# Patient Record
Sex: Male | Born: 1945 | Race: Black or African American | Hispanic: No | Marital: Married
Health system: Southern US, Community
[De-identification: ages and names within clinical notes are randomized; demographics above are authoritative.]

## PROBLEM LIST (undated history)

## (undated) DIAGNOSIS — N184 Chronic kidney disease, stage 4 (severe): Secondary | ICD-10-CM

## (undated) DIAGNOSIS — J189 Pneumonia, unspecified organism: Secondary | ICD-10-CM

## (undated) DIAGNOSIS — J9621 Acute and chronic respiratory failure with hypoxia: Secondary | ICD-10-CM

## (undated) DIAGNOSIS — U071 COVID-19: Secondary | ICD-10-CM

## (undated) DIAGNOSIS — I482 Chronic atrial fibrillation, unspecified: Secondary | ICD-10-CM

## (undated) DIAGNOSIS — J188 Other pneumonia, unspecified organism: Secondary | ICD-10-CM

---

## 2020-09-28 ENCOUNTER — Other Ambulatory Visit (HOSPITAL_COMMUNITY): Payer: Medicare Other

## 2020-09-28 ENCOUNTER — Inpatient Hospital Stay
Admission: RE | Admit: 2020-09-28 | Discharge: 2020-11-09 | Disposition: E | Payer: Medicare Other | Attending: Internal Medicine | Admitting: Internal Medicine

## 2020-09-28 DIAGNOSIS — Z452 Encounter for adjustment and management of vascular access device: Secondary | ICD-10-CM

## 2020-09-28 DIAGNOSIS — U071 COVID-19: Secondary | ICD-10-CM | POA: Diagnosis present

## 2020-09-28 DIAGNOSIS — J188 Other pneumonia, unspecified organism: Secondary | ICD-10-CM | POA: Diagnosis present

## 2020-09-28 DIAGNOSIS — J189 Pneumonia, unspecified organism: Secondary | ICD-10-CM

## 2020-09-28 DIAGNOSIS — R0689 Other abnormalities of breathing: Secondary | ICD-10-CM

## 2020-09-28 DIAGNOSIS — R112 Nausea with vomiting, unspecified: Secondary | ICD-10-CM

## 2020-09-28 DIAGNOSIS — I482 Chronic atrial fibrillation, unspecified: Secondary | ICD-10-CM | POA: Diagnosis present

## 2020-09-28 DIAGNOSIS — J9621 Acute and chronic respiratory failure with hypoxia: Secondary | ICD-10-CM | POA: Diagnosis present

## 2020-09-28 DIAGNOSIS — Z4659 Encounter for fitting and adjustment of other gastrointestinal appliance and device: Secondary | ICD-10-CM

## 2020-09-28 DIAGNOSIS — N184 Chronic kidney disease, stage 4 (severe): Secondary | ICD-10-CM | POA: Diagnosis present

## 2020-09-28 DIAGNOSIS — J90 Pleural effusion, not elsewhere classified: Secondary | ICD-10-CM

## 2020-09-28 DIAGNOSIS — N179 Acute kidney failure, unspecified: Secondary | ICD-10-CM

## 2020-09-28 DIAGNOSIS — R1310 Dysphagia, unspecified: Secondary | ICD-10-CM

## 2020-09-28 DIAGNOSIS — L819 Disorder of pigmentation, unspecified: Secondary | ICD-10-CM

## 2020-09-28 DIAGNOSIS — Z0189 Encounter for other specified special examinations: Secondary | ICD-10-CM

## 2020-09-28 HISTORY — DX: Acute and chronic respiratory failure with hypoxia: J96.21

## 2020-09-28 HISTORY — DX: Chronic atrial fibrillation, unspecified: I48.20

## 2020-09-28 HISTORY — DX: COVID-19: U07.1

## 2020-09-28 HISTORY — DX: Other pneumonia, unspecified organism: J18.8

## 2020-09-28 HISTORY — DX: Chronic kidney disease, stage 4 (severe): N18.4

## 2020-09-28 HISTORY — DX: Pneumonia, unspecified organism: J18.9

## 2020-09-28 LAB — BLOOD GAS, ARTERIAL
Acid-base deficit: 6.1 mmol/L — ABNORMAL HIGH (ref 0.0–2.0)
Bicarbonate: 18.9 mmol/L — ABNORMAL LOW (ref 20.0–28.0)
FIO2: 55
O2 Saturation: 88 %
Patient temperature: 37
pCO2 arterial: 37.7 mmHg (ref 32.0–48.0)
pH, Arterial: 7.32 — ABNORMAL LOW (ref 7.350–7.450)
pO2, Arterial: 63.9 mmHg — ABNORMAL LOW (ref 83.0–108.0)

## 2020-09-28 LAB — PROTIME-INR
INR: 3.4 — ABNORMAL HIGH (ref 0.8–1.2)
Prothrombin Time: 33.5 seconds — ABNORMAL HIGH (ref 11.4–15.2)

## 2020-09-28 LAB — VANCOMYCIN, RANDOM: Vancomycin Rm: 23

## 2020-09-29 LAB — COMPREHENSIVE METABOLIC PANEL
ALT: 14 U/L (ref 0–44)
AST: 26 U/L (ref 15–41)
Albumin: 3 g/dL — ABNORMAL LOW (ref 3.5–5.0)
Alkaline Phosphatase: 86 U/L (ref 38–126)
Anion gap: 15 (ref 5–15)
BUN: 84 mg/dL — ABNORMAL HIGH (ref 8–23)
CO2: 19 mmol/L — ABNORMAL LOW (ref 22–32)
Calcium: 9 mg/dL (ref 8.9–10.3)
Chloride: 109 mmol/L (ref 98–111)
Creatinine, Ser: 4.52 mg/dL — ABNORMAL HIGH (ref 0.61–1.24)
GFR, Estimated: 13 mL/min — ABNORMAL LOW (ref >60)
Glucose, Bld: 176 mg/dL — ABNORMAL HIGH (ref 70–99)
Potassium: 5 mmol/L (ref 3.5–5.1)
Sodium: 143 mmol/L (ref 135–145)
Total Bilirubin: 1 mg/dL (ref 0.3–1.2)
Total Protein: 5.4 g/dL — ABNORMAL LOW (ref 6.5–8.1)

## 2020-09-29 LAB — CBC WITH DIFFERENTIAL/PLATELET
Abs Immature Granulocytes: 0.01 10*3/uL (ref 0.00–0.07)
Basophils Absolute: 0 10*3/uL (ref 0.0–0.1)
Basophils Relative: 0 %
Eosinophils Absolute: 0.3 10*3/uL (ref 0.0–0.5)
Eosinophils Relative: 8 %
HCT: 31 % — ABNORMAL LOW (ref 39.0–52.0)
Hemoglobin: 9.4 g/dL — ABNORMAL LOW (ref 13.0–17.0)
Immature Granulocytes: 0 %
Lymphocytes Relative: 10 %
Lymphs Abs: 0.4 10*3/uL — ABNORMAL LOW (ref 0.7–4.0)
MCH: 25.4 pg — ABNORMAL LOW (ref 26.0–34.0)
MCHC: 30.3 g/dL (ref 30.0–36.0)
MCV: 83.8 fL (ref 80.0–100.0)
Monocytes Absolute: 0.2 10*3/uL (ref 0.1–1.0)
Monocytes Relative: 5 %
Neutro Abs: 3 10*3/uL (ref 1.7–7.7)
Neutrophils Relative %: 77 %
Platelets: 83 10*3/uL — ABNORMAL LOW (ref 150–400)
RBC: 3.7 MIL/uL — ABNORMAL LOW (ref 4.22–5.81)
RDW: 24 % — ABNORMAL HIGH (ref 11.5–15.5)
WBC: 3.9 10*3/uL — ABNORMAL LOW (ref 4.0–10.5)
nRBC: 0.8 % — ABNORMAL HIGH (ref 0.0–0.2)

## 2020-09-29 LAB — PROTIME-INR
INR: 3.4 — ABNORMAL HIGH (ref 0.8–1.2)
Prothrombin Time: 33.4 seconds — ABNORMAL HIGH (ref 11.4–15.2)

## 2020-09-29 LAB — TSH: TSH: 5.55 u[IU]/mL — ABNORMAL HIGH (ref 0.350–4.500)

## 2020-09-29 LAB — HEMOGLOBIN A1C
Hgb A1c MFr Bld: 5.3 % (ref 4.8–5.6)
Mean Plasma Glucose: 105.41 mg/dL

## 2020-09-29 LAB — T4, FREE: Free T4: 0.75 ng/dL (ref 0.61–1.12)

## 2020-09-30 LAB — PROTIME-INR
INR: 2.1 — ABNORMAL HIGH (ref 0.8–1.2)
Prothrombin Time: 22.4 seconds — ABNORMAL HIGH (ref 11.4–15.2)

## 2020-10-01 LAB — BASIC METABOLIC PANEL
Anion gap: 9 (ref 5–15)
BUN: 110 mg/dL — ABNORMAL HIGH (ref 8–23)
CO2: 24 mmol/L (ref 22–32)
Calcium: 10.8 mg/dL — ABNORMAL HIGH (ref 8.9–10.3)
Chloride: 113 mmol/L — ABNORMAL HIGH (ref 98–111)
Creatinine, Ser: 4.76 mg/dL — ABNORMAL HIGH (ref 0.61–1.24)
GFR, Estimated: 12 mL/min — ABNORMAL LOW (ref >60)
Glucose, Bld: 222 mg/dL — ABNORMAL HIGH (ref 70–99)
Potassium: 5.3 mmol/L — ABNORMAL HIGH (ref 3.5–5.1)
Sodium: 146 mmol/L — ABNORMAL HIGH (ref 135–145)

## 2020-10-01 LAB — PROTIME-INR
INR: 1.6 — ABNORMAL HIGH (ref 0.8–1.2)
Prothrombin Time: 18.8 seconds — ABNORMAL HIGH (ref 11.4–15.2)

## 2020-10-01 LAB — CBC
HCT: 27.2 % — ABNORMAL LOW (ref 39.0–52.0)
Hemoglobin: 9.7 g/dL — ABNORMAL LOW (ref 13.0–17.0)
MCH: 33.3 pg (ref 26.0–34.0)
MCHC: 35.7 g/dL (ref 30.0–36.0)
MCV: 93.5 fL (ref 80.0–100.0)
Platelets: 96 10*3/uL — ABNORMAL LOW (ref 150–400)
RBC: 2.91 MIL/uL — ABNORMAL LOW (ref 4.22–5.81)
RDW: 29.7 % — ABNORMAL HIGH (ref 11.5–15.5)
WBC: 5.6 10*3/uL (ref 4.0–10.5)
nRBC: 0.5 % — ABNORMAL HIGH (ref 0.0–0.2)

## 2020-10-01 LAB — VANCOMYCIN, TROUGH: Vancomycin Tr: 23 ug/mL (ref 15–20)

## 2020-10-01 LAB — MAGNESIUM: Magnesium: 2.5 mg/dL — ABNORMAL HIGH (ref 1.7–2.4)

## 2020-10-01 NOTE — Consult Note (Signed)
[facility] CONSULT NOTE    Date: 10/01/2020                  Patient Name:  Jesus Boyd  MRN: [id number]  DOB: 02/16/1946  Age / Sex: 75 y.o., male         PCP: Patient, No Pcp Per                 Service Requesting Consult:  Hospitalist                 Reason for Consult:  Acute kidney injury in the setting of prior renal transplant.            History of Present Illness: Patient is a 75 y.o. 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  He came to the outside hospital with complaints of shortness of breath and fatigue.  He was found to have COVID-19 pneumonia.  Patient received Tocilizumab and dexamethasone x10 days.  His BUN is now rising.  Upon discharge from the outside hospital his BUN was 78 with a creatinine of 3.73.  Currently BUN up to 110 with a creatinine of 4.76.  Serum sodium is also up to 146 with a potassium of 5.3.  Patient is bladder incontinent therefore urine output unknown at this time.   Medications:  Current medications: Allopurinol 100 mg daily, atorvastatin 10 mg nightly, calcitriol 0.25 mcg daily, calcium carbonate 500 mg 3 times daily, ceftriaxone 2 g IV daily, dapsone 100 mg daily, Colace 100 mg twice daily, donepezil 5 mg nightly, famotidine 10 mg daily, folic acid 2 mg daily, hydroxyurea 500 mg twice daily, Coumadin 3 mg daily, levothyroxine 125 mcg daily, magnesium oxide 400 mg twice daily, metoprolol 12.5 mg twice daily, MiraLAX 17 g daily, prednisone 10 mg daily, Protostat 30 cc twice daily, tacrolimus 3 mg twice daily,  torsemide 30 mg daily, vitamin D 1000 units daily  Allergies: No known drug allergies   Past Medical History: acute respiratory failure secondary to COVID-19 pneumonia, acute kidney injury in the setting of chronic kidney disease with renal transplantation, metabolic acidosis, dementia, 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  Social History: Unable to obtain from patient as he is currently lethargic.  Review of Systems: Unable to obtain from patient as he is currently lethargic.  Vital Signs: Temperature 97.2 pulse 115 respirations 24 blood pressure 133/70 Weight trends: There were no vitals filed for this visit.   Physical Exam: General:  Lethargic  Head:  Normocephalic, atraumatic.  Dry oral mucosal membranes  Eyes:  Anicteric  Neck:  Supple  Lungs:   Scattered rhonchi, normal effort  Heart:  S1S2 irregular, tachycardic  Abdomen:   Soft, nontender, bowel sounds present  Extremities:  No peripheral edema.  Neurologic:  Lethargic but arousable  Skin:  No acute rash  GU:  No suprapubic tenderness    Lab results: Basic Metabolic Panel: Recent Labs  Lab 09/29/20 0559  10/01/20 0428  NA 143 146*  K 5.0 5.3*  CL 109 113*  CO2 19* 24  GLUCOSE 176* 222*  BUN 84* 110*  CREATININE 4.52* 4.76*  CALCIUM 9.0 10.8*  MG  --  2.5*    Liver Function Tests: Recent Labs  Lab 09/29/20 0559  AST 26  ALT 14  ALKPHOS 86  BILITOT 1.0  PROT 5.4*  ALBUMIN 3.0*   No results for input(s): LIPASE, AMYLASE in the last 168 hours. No results for input(s): AMMONIA in the last 168 hours.  CBC: Recent Labs  Lab 09/29/20 0559 10/01/20 0428  WBC 3.9* 5.6  NEUTROABS 3.0  --   HGB  9.4* 9.7*  HCT 31.0* 27.2*  MCV 83.8 93.5  PLT 83* 96*    Cardiac Enzymes: No results for input(s): CKTOTAL, CKMB, CKMBINDEX, TROPONINI in the last 168 hours.  BNP: Invalid input(s): POCBNP  CBG: No results for input(s): GLUCAP in the last 168 hours.  Microbiology: No results found for this or any previous visit.  Coagulation Studies: Recent Labs    09/29/20 0559 09/30/20 0847 10/01/20 0428  LABPROT 33.4* 22.4* 18.8*  INR 3.4* 2.1* 1.6*    Urinalysis: No results for input(s): COLORURINE, LABSPEC, PHURINE, GLUCOSEU, HGBUR, BILIRUBINUR, KETONESUR, PROTEINUR, UROBILINOGEN, NITRITE, LEUKOCYTESUR in the last 72 hours.  Invalid input(s): APPERANCEUR    Imaging:  No results found.   Assessment & Plan: Pt is a 75 y.o. 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  1.  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  Patient was on Myfortic 360 mg twice daily, prednisone 5 mg daily, and Prograf 3 mg twice daily pre-COVID-19 pneumonia.  It appears that he was receiving Prograf and prednisone but Myfortic was held.  Patient now with rising BUN and creatinine.  Certainly it appears that he may be dehydrated now.  Hold torsemide and provide the patient with IV fluid hydration with half-normal saline at 50 cc/h.  Resume Myfortic 360 mg twice daily for now.  2.  Acute respiratory failure secondary to COVID-19 pneumonia.  Continue supplemental oxygen at this time.  3.  Hypernatremia.  Serum sodium high at 146.  We will start the  patient on half-normal saline.

## 2020-10-02 LAB — BASIC METABOLIC PANEL
Anion gap: 11 (ref 5–15)
BUN: 118 mg/dL — ABNORMAL HIGH (ref 8–23)
CO2: 24 mmol/L (ref 22–32)
Calcium: 11.3 mg/dL — ABNORMAL HIGH (ref 8.9–10.3)
Chloride: 108 mmol/L (ref 98–111)
Creatinine, Ser: 4.51 mg/dL — ABNORMAL HIGH (ref 0.61–1.24)
GFR, Estimated: 13 mL/min — ABNORMAL LOW (ref >60)
Glucose, Bld: 170 mg/dL — ABNORMAL HIGH (ref 70–99)
Potassium: 4.8 mmol/L (ref 3.5–5.1)
Sodium: 143 mmol/L (ref 135–145)

## 2020-10-02 LAB — PROTIME-INR
INR: 1.7 — ABNORMAL HIGH (ref 0.8–1.2)
Prothrombin Time: 19.6 seconds — ABNORMAL HIGH (ref 11.4–15.2)

## 2020-10-02 LAB — VANCOMYCIN, TROUGH: Vancomycin Tr: 19 ug/mL (ref 15–20)

## 2020-10-03 LAB — RENAL FUNCTION PANEL
Albumin: 2.9 g/dL — ABNORMAL LOW (ref 3.5–5.0)
Anion gap: 12 (ref 5–15)
BUN: 120 mg/dL — ABNORMAL HIGH (ref 8–23)
CO2: 24 mmol/L (ref 22–32)
Calcium: 11.4 mg/dL — ABNORMAL HIGH (ref 8.9–10.3)
Chloride: 107 mmol/L (ref 98–111)
Creatinine, Ser: 4.28 mg/dL — ABNORMAL HIGH (ref 0.61–1.24)
GFR, Estimated: 14 mL/min — ABNORMAL LOW (ref >60)
Glucose, Bld: 309 mg/dL — ABNORMAL HIGH (ref 70–99)
Phosphorus: 4.2 mg/dL (ref 2.5–4.6)
Potassium: 6.4 mmol/L (ref 3.5–5.1)
Sodium: 143 mmol/L (ref 135–145)

## 2020-10-03 LAB — TACROLIMUS LEVEL: Tacrolimus (FK506) - LabCorp: 10.5 ng/mL (ref 2.0–20.0)

## 2020-10-03 LAB — PROTIME-INR
INR: 2.3 — ABNORMAL HIGH (ref 0.8–1.2)
Prothrombin Time: 24.5 seconds — ABNORMAL HIGH (ref 11.4–15.2)

## 2020-10-03 LAB — CBC
HCT: 27 % — ABNORMAL LOW (ref 39.0–52.0)
Hemoglobin: 10 g/dL — ABNORMAL LOW (ref 13.0–17.0)
MCH: 34.4 pg — ABNORMAL HIGH (ref 26.0–34.0)
MCHC: 37 g/dL — ABNORMAL HIGH (ref 30.0–36.0)
MCV: 92.8 fL (ref 80.0–100.0)
Platelets: 90 10*3/uL — ABNORMAL LOW (ref 150–400)
RBC: 2.91 MIL/uL — ABNORMAL LOW (ref 4.22–5.81)
RDW: 28.9 % — ABNORMAL HIGH (ref 11.5–15.5)
WBC: 3.1 10*3/uL — ABNORMAL LOW (ref 4.0–10.5)
nRBC: 1 % — ABNORMAL HIGH (ref 0.0–0.2)

## 2020-10-03 LAB — MAGNESIUM: Magnesium: 2 mg/dL (ref 1.7–2.4)

## 2020-10-03 LAB — POTASSIUM: Potassium: 5.5 mmol/L — ABNORMAL HIGH (ref 3.5–5.1)

## 2020-10-03 LAB — VANCOMYCIN, TROUGH: Vancomycin Tr: 19 ug/mL (ref 15–20)

## 2020-10-03 NOTE — Progress Notes (Signed)
[facility]  ROUNDING NOTE   Subjective:  Renal function continues to deteriorate a bit. BUN up to 120. Creatinine down slightly to 4.28. However hyperkalemia noted with serum potassium of 5.5.   Objective:  Vital signs in last 24 hours:  Temperature 97.9 pulse 118 respirations 20 blood pressure 118/71  Physical Exam: General:  Critically ill-appearing  Head:  Normocephalic, atraumatic.  Dry oral mucosa  Eyes:  Anicteric  Neck:  Supple  Lungs:   Scattered rhonchi, normal effort  Heart:  S1S2 irregular, tachycardic  Abdomen:   Soft, nontender, bowel sounds present  Extremities:  No peripheral edema.  Neurologic:  Lethargic  Skin:  No acute rash  Access:  No working hemodialysis access    Basic Metabolic Panel: Recent Labs  Lab 09/29/20 0559 10/01/20 0428 10/02/20 0808 10/03/20 0548 10/03/20 1453  NA 143 146* 143 143  --   K 5.0 5.3* 4.8 6.4* 5.5*  CL 109 113* 108 107  --   CO2 19* '24 24 24  '$ --   GLUCOSE 176* 222* 170* 309*  --   BUN 84* 110* 118* 120*  --   CREATININE 4.52* 4.76* 4.51* 4.28*  --   CALCIUM 9.0 10.8* 11.3* 11.4*  --   MG  --  2.5*  --  2.0  --   PHOS  --   --   --  4.2  --     Liver Function Tests: Recent Labs  Lab 09/29/20 0559 10/03/20 0548  AST 26  --   ALT 14  --   ALKPHOS 86  --   BILITOT 1.0  --   PROT 5.4*  --   ALBUMIN 3.0* 2.9*   No results for input(s): LIPASE, AMYLASE in the last 168 hours. No results for input(s): AMMONIA in the last 168 hours.  CBC: Recent Labs  Lab 09/29/20 0559 10/01/20 0428 10/03/20 0548  WBC 3.9* 5.6 3.1*  NEUTROABS 3.0  --   --   HGB 9.4* 9.7* 10.0*  HCT 31.0* 27.2* 27.0*  MCV 83.8 93.5 92.8  PLT 83* 96* 90*    Cardiac Enzymes: No results for input(s): CKTOTAL, CKMB, CKMBINDEX, TROPONINI in the last 168 hours.  BNP: Invalid input(s): POCBNP  CBG: No results for input(s): GLUCAP in the last 168 hours.  Microbiology: No results found for this or any previous  visit.  Coagulation Studies: Recent Labs    10/01/20 0428 10/02/20 0808 10/03/20 0548  LABPROT 18.8* 19.6* 24.5*  INR 1.6* 1.7* 2.3*    Urinalysis: No results for input(s): COLORURINE, LABSPEC, PHURINE, GLUCOSEU, HGBUR, BILIRUBINUR, KETONESUR, PROTEINUR, UROBILINOGEN, NITRITE, LEUKOCYTESUR in the last 72 hours.  Invalid input(s): APPERANCEUR    Imaging: No results found.   Medications:       Assessment/ Plan:  75 y.o. 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  1.  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  BUN up slightly however creatinine down slightly.  Maintain the patient on current doses of Prograf, prednisone, and Myfortic.  Continue gentle IV fluid hydration.  May need to consider dialysis but hold off for now.  This was discussed in depth with the  patient son.  2.  Hyperkalemia.  Serum potassium down to 5.5.  Start the patient on Lokelma 10 g daily.  3.  Anemia of chronic kidney disease.  Hemoglobin currently 10.  Hold off on Procrit at this time.   LOS: 0 Jesus Boyd 2/23/20223:52 PM

## 2020-10-04 LAB — PROTIME-INR
INR: 3 — ABNORMAL HIGH (ref 0.8–1.2)
Prothrombin Time: 30 seconds — ABNORMAL HIGH (ref 11.4–15.2)

## 2020-10-04 LAB — POTASSIUM: Potassium: 5.1 mmol/L (ref 3.5–5.1)

## 2020-10-05 LAB — PROTIME-INR
INR: 2.8 — ABNORMAL HIGH (ref 0.8–1.2)
Prothrombin Time: 28.5 seconds — ABNORMAL HIGH (ref 11.4–15.2)

## 2020-10-05 LAB — CBC
HCT: 24 % — ABNORMAL LOW (ref 39.0–52.0)
Hemoglobin: 8.9 g/dL — ABNORMAL LOW (ref 13.0–17.0)
MCH: 33.1 pg (ref 26.0–34.0)
MCHC: 37.1 g/dL — ABNORMAL HIGH (ref 30.0–36.0)
MCV: 89.2 fL (ref 80.0–100.0)
Platelets: 95 10*3/uL — ABNORMAL LOW (ref 150–400)
RBC: 2.69 MIL/uL — ABNORMAL LOW (ref 4.22–5.81)
RDW: 27.9 % — ABNORMAL HIGH (ref 11.5–15.5)
WBC: 3.2 10*3/uL — ABNORMAL LOW (ref 4.0–10.5)
nRBC: 8.9 % — ABNORMAL HIGH (ref 0.0–0.2)

## 2020-10-05 LAB — PATHOLOGIST SMEAR REVIEW

## 2020-10-05 LAB — RENAL FUNCTION PANEL
Albumin: 2.6 g/dL — ABNORMAL LOW (ref 3.5–5.0)
Anion gap: 12 (ref 5–15)
BUN: 105 mg/dL — ABNORMAL HIGH (ref 8–23)
CO2: 24 mmol/L (ref 22–32)
Calcium: 11.4 mg/dL — ABNORMAL HIGH (ref 8.9–10.3)
Chloride: 106 mmol/L (ref 98–111)
Creatinine, Ser: 3.45 mg/dL — ABNORMAL HIGH (ref 0.61–1.24)
GFR, Estimated: 18 mL/min — ABNORMAL LOW (ref >60)
Glucose, Bld: 320 mg/dL — ABNORMAL HIGH (ref 70–99)
Phosphorus: 4.1 mg/dL (ref 2.5–4.6)
Potassium: 5 mmol/L (ref 3.5–5.1)
Sodium: 142 mmol/L (ref 135–145)

## 2020-10-05 LAB — VANCOMYCIN, TROUGH: Vancomycin Tr: 18 ug/mL (ref 15–20)

## 2020-10-05 LAB — MAGNESIUM: Magnesium: 1.9 mg/dL (ref 1.7–2.4)

## 2020-10-05 NOTE — Progress Notes (Signed)
[facility]  ROUNDING NOTE   Subjective:  BUN down to 105 and creatinine down to 3.45. Patient producing urine. Serum potassium down to 5.0.    Objective:  Vital signs in last 24 hours:  Temperature 98.9 pulse 150 respirations 32 blood pressure 121/65  Physical Exam: General:  Critically ill-appearing  Head:  Normocephalic, atraumatic.  Dry oral mucosa  Eyes:  Anicteric  Neck:  Supple  Lungs:   Scattered rhonchi, normal effort  Heart:  S1S2 irregular, tachycardic  Abdomen:   Soft, nontender, bowel sounds present  Extremities:  No peripheral edema.  Neurologic:  Lethargic, arousable  Skin:  No acute rash  Access:  No functional hemodialysis access    Basic Metabolic Panel: Recent Labs  Lab 09/29/20 0559 10/01/20 0428 10/02/20 0808 10/03/20 0548 10/03/20 1453 10/04/20 0409 10/05/20 0516  NA 143 146* 143 143  --   --  142  K 5.0 5.3* 4.8 6.4* 5.5* 5.1 5.0  CL 109 113* 108 107  --   --  106  CO2 19* '24 24 24  '$ --   --  24  GLUCOSE 176* 222* 170* 309*  --   --  320*  BUN 84* 110* 118* 120*  --   --  105*  CREATININE 4.52* 4.76* 4.51* 4.28*  --   --  3.45*  CALCIUM 9.0 10.8* 11.3* 11.4*  --   --  11.4*  MG  --  2.5*  --  2.0  --   --  1.9  PHOS  --   --   --  4.2  --   --  4.1    Liver Function Tests: Recent Labs  Lab 09/29/20 0559 10/03/20 0548 10/05/20 0516  AST 26  --   --   ALT 14  --   --   ALKPHOS 86  --   --   BILITOT 1.0  --   --   PROT 5.4*  --   --   ALBUMIN 3.0* 2.9* 2.6*   No results for input(s): LIPASE, AMYLASE in the last 168 hours. No results for input(s): AMMONIA in the last 168 hours.  CBC: Recent Labs  Lab 09/29/20 0559 10/01/20 0428 10/03/20 0548 10/05/20 0516  WBC 3.9* 5.6 3.1* 3.2*  NEUTROABS 3.0  --   --   --   HGB 9.4* 9.7* 10.0* 8.9*  HCT 31.0* 27.2* 27.0* 24.0*  MCV 83.8 93.5 92.8 89.2  PLT 83* 96* 90* 95*    Cardiac Enzymes: No results for input(s): CKTOTAL, CKMB, CKMBINDEX, TROPONINI in the last 168  hours.  BNP: Invalid input(s): POCBNP  CBG: No results for input(s): GLUCAP in the last 168 hours.  Microbiology: No results found for this or any previous visit.  Coagulation Studies: Recent Labs    10/03/20 0548 10/04/20 0409 10/05/20 0516  LABPROT 24.5* 30.0* 28.5*  INR 2.3* 3.0* 2.8*    Urinalysis: No results for input(s): COLORURINE, LABSPEC, PHURINE, GLUCOSEU, HGBUR, BILIRUBINUR, KETONESUR, PROTEINUR, UROBILINOGEN, NITRITE, LEUKOCYTESUR in the last 72 hours.  Invalid input(s): APPERANCEUR    Imaging: No results found.   Medications:       Assessment/ Plan:  75 y.o. 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  1.  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  BUN down to 105 with a creatinine of 3.45.  Patient is producing urine.  No immediate need for dialysis at the moment.  Continue current doses of Prograf, Myfortic, prednisone.  2.  Hyperkalemia.  Potassium down to 5.0.  Maintain the patient on Lokelma 10 g daily.  3.  Anemia of chronic kidney disease.  Hemoglobin drifting down to 8.9.  May be dilutional l in part.  Continue to monitor CBC.  No immediate need for transfusion.    LOS: 0 Jesus Boyd 2/25/20228:08 AM

## 2020-10-06 ENCOUNTER — Other Ambulatory Visit (HOSPITAL_COMMUNITY): Payer: Medicare Other

## 2020-10-06 DIAGNOSIS — J9621 Acute and chronic respiratory failure with hypoxia: Secondary | ICD-10-CM | POA: Diagnosis not present

## 2020-10-06 DIAGNOSIS — U071 COVID-19: Secondary | ICD-10-CM | POA: Diagnosis not present

## 2020-10-06 DIAGNOSIS — N184 Chronic kidney disease, stage 4 (severe): Secondary | ICD-10-CM

## 2020-10-06 DIAGNOSIS — I482 Chronic atrial fibrillation, unspecified: Secondary | ICD-10-CM | POA: Diagnosis not present

## 2020-10-06 LAB — BLOOD GAS, ARTERIAL
Acid-Base Excess: 2.3 mmol/L — ABNORMAL HIGH (ref 0.0–2.0)
Bicarbonate: 26.5 mmol/L (ref 20.0–28.0)
FIO2: 30
O2 Saturation: 83.2 %
Patient temperature: 37
pCO2 arterial: 42.1 mmHg (ref 32.0–48.0)
pH, Arterial: 7.414 (ref 7.350–7.450)
pO2, Arterial: 52.6 mmHg — ABNORMAL LOW (ref 83.0–108.0)

## 2020-10-06 LAB — PROTIME-INR
INR: 2.6 — ABNORMAL HIGH (ref 0.8–1.2)
Prothrombin Time: 26.6 seconds — ABNORMAL HIGH (ref 11.4–15.2)

## 2020-10-06 NOTE — Consult Note (Signed)
Infectious Disease Consultation   Jesus Boyd  [id number]  DOB: [birth year]  DOA: 10/02/2020  Requesting physician: Dr. Laren Everts  Reason for consultation: Antibiotic recommendations   History of Present Illness: Jesus Boyd is an 75 y.o. male with medical history significant of Alzheimer's dementia, atrial fibrillation, congestive heart failure, CKD, COPD, CVA, diabetes mellitus, hyperlipidemia, history of renal transplant in 2007, sleep apnea on CPAP who is admitted at [facility] when he initially presented to the ED on 09/07/2020 with complaints of worsening shortness of breath and fatigue.  In ED he was found to have fever of 101, oxygen saturation was 93% on 3 L nasal cannula.  He was also found to be leukopenic with creatinine above his baseline at 3.78.  Chest x-ray showed partially loculated right pleural effusion with persistent ill-defined opacity in the medial right base.  He was found to be positive for COVID-19 infection, sepsis likely secondary to bacterial pneumonia.  He was treated with Tocilizumab and was given Decadron for 10 days.  He was started on antibiotic treatment with IV vancomycin, cefepime. On 09/11/2020 his oxygen requirement escalated to 55 L, 100% FiO2 plus nonrebreather and he was transferred to the intensive care unit for further management.  His hospital course complicated by AKI on chronic kidney disease.  Throughout his hospital course he required varying amount of supplemental oxygen ranging from OptiFlow to BiPAP.  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  Infectious disease was consulted.  He was switched to meropenem.  He was seen by infectious disease and eventually antibiotics changed to ceftriaxone, vancomycin.  Plan to treat for duration of 6 weeks.  He also has a history of atrial fibrillation and was continued on metoprolol and Coumadin.  Due to his complex medical  problems he was transferred and admitted to [facility]. Poor historian. He is currently on oxygen by nasal cannula. Complaining of some shortness of breath.  Denies having any fevers, chills, nausea, vomiting, abdominal pain, diarrhea or dysuria.   Review of Systems:  Review of systems negative except as mentioned above in the HPI  Past Medical History: . Acute on chronic diastolic congestive heart failure ([place]) 06/26/2017  . Benign neoplasm of right conjunctiva  . COPD (chronic obstructive pulmonary disease) ([place])  . Diabetes mellitus  . Diabetes mellitus type 2, uncontrolled ([place])  . Dry eye syndrome of both lacrimal glands  . Episodic tobacco abuse  . H/O cardiac catheterization  . Hypertension  . Kidney replaced by transplant  . Myopia of both eyes  . NPDR (nonproliferative diabetic retinopathy) ([place])  . Pinguecula of left eye  . Presbyopia OU  . Renal disorder  . Secondary hyperparathyroidism ([place])  . Sleep apnea  using CPAP  . Stroke [facility]) 2013  affected speech and memory  . Subconjunctival hemorrhage of left eye  . Tobacco dependence  . Transient cerebral ischemia  . Viral warts    Past Surgical History: . AORTIC VALVE REPLACEMENT  . ARTERIOVENOUS surgery creation of A-V Fistula  . CARDIAC VALVE REPLACEMENT  mitral  . CATARACT EXTRACTION W/ INTRAOCULAR LENS IMPLANT Right 07/09/2016  . CATARACT EXTRACTION W/ INTRAOCULAR LENS IMPLANT Left 04/15/2017  . CATARACT EXTRACTION W/ INTRAOCULAR LENS IMPLANT Left 04/15/2017  . COLONOSCOPY  . ESOPHAGOGASTRODUODENOSCOPY  . EYE SURGERY  . KIDNEY TRANSPLANT Right 2007  . NEPHRECTOMY  . PARATHYROIDECTOMY N/A 12/23/2017   Allergies: No known  drug allergies  Social History: . Smoking status: Former Smoker  Packs/day: 0.50  Years: 50.00  Pack years: 25.00  Quit date: 04/10/2012  Years since quitting: 8.4  . Smokeless tobacco: Never Used  Substance and Sexual Activity  . Alcohol use: Not Currently  . Drug  use: Not Currently   Family History: . Diabetes Mother  . Hypertension Mother  . Kidney disease Mother  . Cancer Father  . No Known Problems Sister  . Thyroid disease Sister  . Hypertension Brother  . Thyroid disease Brother  . Glaucoma Neg Hx  . Macular degeneration Neg Hx     Physical Exam: Vitals: Temperature 97.5, pulse 91, respiratory rate 30, blood pressure 105/84, pulse oximetry 99% Constitutional: Thin, frail, chronically ill-appearing male Head: Atraumatic, normocephalic Eyes: PERLA, EOMI ENMT: external ears and nose appear normal, normal hearing, has NG tube in place,            Lips appears normal, poor dentition, dry oral mucosa  Neck: neck appears normal, no masses CVS: S1-S2, irregular, tachycardic Respiratory: Coarse breath sounds, rhonchi Abdomen: soft nontender, nondistended, normal bowel sounds Musculoskeletal: No edema, right hand finger with discoloration, lower extremity ptosis with discoloration Neuro: has debility with generalized weakness, Psych:stable mood and affect Skin: No new rashes  Data reviewed:  I have personally reviewed following labs and imaging studies Labs:  CBC: Recent Labs  Lab 10/01/20 0428 10/03/20 0548 10/05/20 0516  WBC 5.6 3.1* 3.2*  HGB 9.7* 10.0* 8.9*  HCT 27.2* 27.0* 24.0*  MCV 93.5 92.8 89.2  PLT 96* 90* 95*    Basic Metabolic Panel: Recent Labs  Lab 10/01/20 0428 10/02/20 0808 10/03/20 0548 10/03/20 1453 10/04/20 0409 10/05/20 0516  NA 146* 143 143  --   --  142  K 5.3* 4.8 6.4*   < > 5.1 5.0  CL 113* 108 107  --   --  106  CO2 '24 24 24  '$ --   --  24  GLUCOSE 222* 170* 309*  --   --  320*  BUN 110* 118* 120*  --   --  105*  CREATININE 4.76* 4.51* 4.28*  --   --  3.45*  CALCIUM 10.8* 11.3* 11.4*  --   --  11.4*  MG 2.5*  --  2.0  --   --  1.9  PHOS  --   --  4.2  --   --  4.1   < > = values in this interval not displayed.   GFR CrCl cannot be calculated (Unknown ideal weight.). Liver Function  Tests: Recent Labs  Lab 10/03/20 0548 10/05/20 0516  ALBUMIN 2.9* 2.6*   No results for input(s): LIPASE, AMYLASE in the last 168 hours. No results for input(s): AMMONIA in the last 168 hours. Coagulation profile Recent Labs  Lab 10/02/20 0808 10/03/20 0548 10/04/20 0409 10/05/20 0516 10/06/20 0336  INR 1.7* 2.3* 3.0* 2.8* 2.6*    Cardiac Enzymes: No results for input(s): CKTOTAL, CKMB, CKMBINDEX, TROPONINI in the last 168 hours. BNP: Invalid input(s): POCBNP CBG: No results for input(s): GLUCAP in the last 168 hours. D-Dimer No results for input(s): DDIMER in the last 72 hours. Hgb A1c No results for input(s): HGBA1C in the last 72 hours. Lipid Profile No results for input(s): CHOL, HDL, LDLCALC, TRIG, CHOLHDL, LDLDIRECT in the last 72 hours. Thyroid function studies No results for input(s): TSH, T4TOTAL, T3FREE, THYROIDAB in the last 72 hours.  Invalid input(s): FREET3 Anemia work up No results for input(s):  VITAMINB12, FOLATE, FERRITIN, TIBC, IRON, RETICCTPCT in the last 72 hours. Urinalysis No results found for: COLORURINE, APPEARANCEUR, LABSPEC, [place], GLUCOSEU, HGBUR, BILIRUBINUR, KETONESUR, PROTEINUR, UROBILINOGEN, NITRITE, LEUKOCYTESUR   Sepsis Labs Invalid input(s): PROCALCITONIN,  WBC,  LACTICIDVEN Microbiology No results found for this or any previous visit (from the past 240 hour(s)).  Inpatient Medications:   Please see MAR  Radiological Exams on Admission: DG Chest Port 1 View  Result Date: 10/06/2020 CLINICAL DATA:  75 year old male with history of COVID pneumonia with shortness of breath. EXAM: PORTABLE CHEST 1 VIEW COMPARISON:  Chest x-ray 09/17/2020. FINDINGS: There is a right upper extremity PICC with tip terminating in the right atrium. A nasogastric tube is seen extending into the stomach, however, the tip of the nasogastric tube extends below the lower margin of the image. Lung volumes are normal. Diffuse patchy interstitial and airspace  disease again noted throughout the lungs bilaterally, with no significant improvement in aeration compared to the prior examination. Possible trace bilateral pleural effusions. No pneumothorax. Pulmonary vasculature is obscured. Heart size is mildly enlarged. Upper mediastinal contours are within normal limits. Aortic atherosclerosis. Status post median sternotomy for aortic and mitral valve replacements. IMPRESSION: 1. Support apparatus, as above. 2. Persistent severe multilobar bilateral pneumonia without substantial change compared to the prior study, as above. Possible trace bilateral pleural effusions. 3. Mild cardiomegaly. 4. Aortic atherosclerosis. Electronically Signed   By: Vinnie Langton M.D.   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  He received treatment with Tocilizumab and dexamethasone at the acute facility.  He is also immunocompromised on immunosuppressive medication secondary to renal transplant.  He has probable secondary bacterial pneumonia with chest x-ray that is showing severe multilobar bilateral pneumonia.  He received treatment with multiple antibiotics at the acute facility including IV vancomycin, meropenem.  He is currently on treatment with IV vancomycin, ceftriaxone.  Plan to treat for a duration of 6 weeks with tentative end date 10/24/2020 for the endocarditis.  He also unfortunately has dysphagia and high risk for aspiration and recurrent  aspiration pneumonia despite being on antibiotics.  COVID-19 infection: He was treated with Tocilizumab and Decadron at the outside facility.  Here he is started on folic acid, hydroxyurea.  He is high risk for sequelae from COVID-19 infection. Continue to monitor closely.  He is on oxygen by nasal cannula.  Pneumonia: His chest imaging consistent with severe multilobar bilateral pneumonia.  High suspicion for secondary bacterial pneumonia.  Unable to send sputum cultures as he is unable to produce sputum at this time.  If he starts having sputum production suggest to send for respiratory cultures.  Currently on antibiotic treatment with IV vancomycin, ceftriaxone.  Continue to monitor closely.  If his respiratory status worsens would recommend to repeat chest imaging preferably chest CT which could be done without contrast in the setting of renal compromise.  Also consider adding Flagyl if his respiratory status worsens for anaerobic coverage.  Prosthetic valve endocarditis: Patient had echocardiogram done at the acute facility which showed vegetation on the prosthetic mitral valve.  His cultures negative.  On antibiotics as mentioned above.  Plan treat for total  duration of 6 weeks with tentative end date of 10/24/2020.  Please monitor BUN/trending closely while on antibiotics and adjust dose accordingly.  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  Antibiotics renally dosed.  Avoid nephrotoxic medication.  Nephrology consulted and following.  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  Currently on antibiotics as mentioned above.  Dysphagia/protein calorie malnutrition: Currently has an NG tube in place.  Unfortunately due to his dysphagia he is high risk for recurrent aspiration and aspiration pneumonia despite being on antibiotics.  Further  management of protein calorie malnutrition per the primary team.  Diabetes mellitus type 2: Continue to monitor Accu-Cheks, medications and management of diabetes per the primary team.  History of Alzheimer's dementia: Continue supportive management per the primary team.  Atrial fibrillation: He remains tachycardic.  Continue medications and management per the primary team.  Chronic anticoagulation: Patient is on Coumadin.  Further management per the primary team. Unfortunately due to his complex medical problems he is very high risk for worsening and decompensation.  Discussed the plan of care with the patient's wife at the bedside.  Also discussed with the primary team.  Thank you for this consultation.    Jesus Boyd M.D. 10/06/2020, 2:16 PM

## 2020-10-06 NOTE — Consult Note (Signed)
Pulmonary [place]  Date of Service: 10/06/2020  PULMONARY CRITICAL CARE Jesus Boyd  [id number]  DOB: January 21, 1946   DOA: 09/26/2020  Referring Physician: Merton Border, MD  HPI: Jesus Boyd is a 75 y.o. male seen for follow up of Acute on Chronic Respiratory Failure.  Patient has multiple medical problems including COPD stage IV kidney disease status post transplant sepsis pneumonia Alzheimer's loculated effusion atrial fibrillation presented to the hospital because of increasing shortness of breath.  Patient apparently had a renal transplant done in 2007 and has had now stage IV kidney disease in the transplant.  Patient was noted to be significantly hypoxic and was started on nonrebreather.  Also was tested positive for COVID-19 given Tocilizumab as well as dexamethasone.  Respiratory failure progressed and patient now presents to [place] on heated high flow.  Appears to be comfortable right now without major distress  Review of Systems:  ROS performed and is unremarkable other than noted above.   Past Medical and Surgical History Past Medical History:  Diagnosis Date  . Acute on chronic diastolic congestive heart failure ([place]) 06/26/2017  . Benign neoplasm of right conjunctiva  . COPD (chronic obstructive pulmonary disease) ([place])  . Diabetes mellitus  . Diabetes mellitus type 2, uncontrolled ([place])  . Dry eye syndrome of both lacrimal glands  . Episodic tobacco abuse  . H/O cardiac catheterization  . Hypertension  . Kidney replaced by transplant  . Myopia of both eyes  . NPDR (nonproliferative diabetic retinopathy) ([place])  . Pinguecula of left eye  . Presbyopia OU  . Renal disorder  . Secondary hyperparathyroidism ([place])  . Sleep apnea  using CPAP  . Stroke [facility]) 2013  affected speech and memory  . Subconjunctival hemorrhage of left eye  . Tobacco dependence  . Transient cerebral ischemia  . Viral  warts   Past Surgical History:  Procedure Laterality Date  . AORTIC VALVE REPLACEMENT  . ARTERIOVENOUS surgery creation of A-V Fistula  . CARDIAC VALVE REPLACEMENT  mitral  . CATARACT EXTRACTION W/ INTRAOCULAR LENS IMPLANT Right 07/09/2016  Dr. Colleen Can [id number];pwr 23.0D;SN [id number]  . CATARACT EXTRACTION W/ INTRAOCULAR LENS IMPLANT Left 04/15/2017  JZF: Model [id number] Power: 20.5D SN [id number]  . CATARACT EXTRACTION W/ INTRAOCULAR LENS IMPLANT Left 04/15/2017  Procedure: CATARACT EXTRACTION LEFT EYE WITH IMPLANT; Surgeon: Eben Burow, MD; Location: [place]; Service: Ophthalmology; Laterality: Left; 30 DEGREE TIP, P.F.LIDOCAINE, AODM, BLOCK, BSS/EPI (8:2)  . COLONOSCOPY  . ESOPHAGOGASTRODUODENOSCOPY  . EYE SURGERY  . KIDNEY TRANSPLANT Right 2007  . NEPHRECTOMY  . PARATHYROIDECTOMY N/A 12/23/2017  Procedure: TOTAL THYROIDECTOMY, PARATHYROIDECTOMY--subtotal with nerve monitoring; Surgeon: Fredirick Maudlin, MD; Location: [facility] MAIN OR; Service: General; Laterality: N/A;   Family History Family History  Problem Relation Age of Onset  . Diabetes Mother  . Hypertension Mother  . Kidney disease Mother  . Cancer Father  . No Known Problems Sister  . Thyroid disease Sister  . Hypertension Brother  . Thyroid disease Brother  . Glaucoma Neg Hx  . Macular degeneration Neg Hx   Social History Social History   Socioeconomic History  . Marital status: Married  Spouse name: Not on file  . Number of children: Not on file  . Years of education: Not on file  . Highest education level: Not on file  Occupational History  . Occupation: disability  Tobacco Use  . Smoking status: Former Smoker  Packs/day: 0.50  Years: 50.00  Pack  years: 25.00  Quit date: 04/10/2012  Years since quitting: 8.4  . Smokeless tobacco: Never Used  Substance and Sexual Activity  . Alcohol use: Not Currently  . Drug use: Not Currently  . Sexual activity: Not on file  Other Topics  Concern  . Not on file  Social History Narrative  . Not on file       Medications: Reviewed on Rounds  Physical Exam:  Vitals: Temperature is 99.6 pulse 124 respiratory 32 blood pressure 110/40 saturations 94%  Ventilator Settings currently on heated high flow  . General: Comfortable at this time . Eyes: Grossly normal lids, irises & conjunctiva . ENT: grossly tongue is normal . Neck: no obvious mass . Cardiovascular: S1-S2 normal no gallop . Respiratory: Coarse rhonchi rales . Abdomen: Soft nontender . Skin: no rash seen on limited exam . Musculoskeletal: not rigid . Psychiatric:unable to assess . Neurologic: no seizure no involuntary movements         Labs on Admission:  Basic Metabolic Panel: Recent Labs  Lab 10/01/20 0428 10/02/20 0808 10/03/20 0548 10/03/20 1453 10/04/20 0409 10/05/20 0516  NA 146* 143 143  --   --  142  K 5.3* 4.8 6.4* 5.5* 5.1 5.0  CL 113* 108 107  --   --  106  CO2 '24 24 24  '$ --   --  24  GLUCOSE 222* 170* 309*  --   --  320*  BUN 110* 118* 120*  --   --  105*  CREATININE 4.76* 4.51* 4.28*  --   --  3.45*  CALCIUM 10.8* 11.3* 11.4*  --   --  11.4*  MG 2.5*  --  2.0  --   --  1.9  PHOS  --   --  4.2  --   --  4.1    Recent Labs  Lab 10/06/20 0928  PHART 7.414  PCO2ART 42.1  PO2ART 52.6*  HCO3 26.5  O2SAT 83.2    Liver Function Tests: Recent Labs  Lab 10/03/20 0548 10/05/20 0516  ALBUMIN 2.9* 2.6*   No results for input(s): LIPASE, AMYLASE in the last 168 hours. No results for input(s): AMMONIA in the last 168 hours.  CBC: Recent Labs  Lab 10/01/20 0428 10/03/20 0548 10/05/20 0516  WBC 5.6 3.1* 3.2*  HGB 9.7* 10.0* 8.9*  HCT 27.2* 27.0* 24.0*  MCV 93.5 92.8 89.2  PLT 96* 90* 95*    Cardiac Enzymes: No results for input(s): CKTOTAL, CKMB, CKMBINDEX, TROPONINI in the last 168 hours.  BNP (last 3 results) No results for input(s): BNP in the last 8760 hours.  ProBNP (last 3 results) No results for input(s):  PROBNP in the last 8760 hours.   Radiological Exams on Admission: DG Chest Port 1 View  Result Date: 10/06/2020 CLINICAL DATA:  75 year old male with history of COVID pneumonia with shortness of breath. EXAM: PORTABLE CHEST 1 VIEW COMPARISON:  Chest x-ray 09/19/2020. FINDINGS: There is a right upper extremity PICC with tip terminating in the right atrium. A nasogastric tube is seen extending into the stomach, however, the tip of the nasogastric tube extends below the lower margin of the image. Lung volumes are normal. Diffuse patchy interstitial and airspace disease again noted throughout the lungs bilaterally, with no significant improvement in aeration compared to the prior examination. Possible trace bilateral pleural effusions. No pneumothorax. Pulmonary vasculature is obscured. Heart size is mildly enlarged. Upper mediastinal contours are within normal limits. Aortic atherosclerosis. Status post median sternotomy for aortic and  mitral valve replacements. IMPRESSION: 1. Support apparatus, as above. 2. Persistent severe multilobar bilateral pneumonia without substantial change compared to the prior study, as above. Possible trace bilateral pleural effusions. 3. Mild cardiomegaly. 4. Aortic atherosclerosis. Electronically Signed   By: Vinnie Langton M.D.   On: 10/06/2020 08:03    Assessment/Plan Active Problems:   Acute on chronic respiratory failure with hypoxia (HCC)   COVID-19 virus infection   Chronic atrial fibrillation (HCC)   Chronic kidney disease, stage IV (severe) (HCC)   Multifocal pneumonia   1. Acute on chronic respiratory failure with hypoxia patient is severely hypoxic chest x-ray shows severe infiltrates probably a combination of volume pneumonia and fluid overload as well as some interstitial lung disease no prior CT is available here to compare we will recommend getting a follow-up echocardiogram 2. COVID-19 virus infection now in recovery phase plan is going to be to  continue with supportive care monitor following closely. 3. Chronic atrial fibrillation rate is controlled we will continue with present therapy. 4. Stage IV chronic kidney disease and transplant kidney.  We will follow the patient's labs closely follow hydration status. 5. Multifocal pneumonia treated slow improvement secondary to COVID-19.  I have personally seen and evaluated the patient, evaluated laboratory and imaging results, formulated the assessment and plan and placed orders. The Patient requires high complexity decision making with multiple systems involvement.  Case was discussed on Rounds with the Respiratory Therapy Director and the Respiratory staff Time Spent 5mnutes  Jesus Balducci A Shalayna Ornstein, MD [facility] HospitalPulmonary Critical Care Medicine Sleep Medicine

## 2020-10-07 DIAGNOSIS — U071 COVID-19: Secondary | ICD-10-CM | POA: Diagnosis not present

## 2020-10-07 DIAGNOSIS — J189 Pneumonia, unspecified organism: Secondary | ICD-10-CM

## 2020-10-07 DIAGNOSIS — N184 Chronic kidney disease, stage 4 (severe): Secondary | ICD-10-CM | POA: Diagnosis not present

## 2020-10-07 DIAGNOSIS — I482 Chronic atrial fibrillation, unspecified: Secondary | ICD-10-CM | POA: Diagnosis not present

## 2020-10-07 DIAGNOSIS — J9621 Acute and chronic respiratory failure with hypoxia: Secondary | ICD-10-CM | POA: Diagnosis not present

## 2020-10-07 LAB — PROTIME-INR
INR: 2.5 — ABNORMAL HIGH (ref 0.8–1.2)
Prothrombin Time: 26.1 seconds — ABNORMAL HIGH (ref 11.4–15.2)

## 2020-10-07 NOTE — Progress Notes (Signed)
Pulmonary Critical Care Medicine [place]   PULMONARY CRITICAL CARE SERVICE  PROGRESS NOTE  Date of Service: 10/07/2020  Jesus Boyd  [id number]  DOB: 07/05/1946   DOA: 09/19/2020  Referring Physician: Merton Border, MD  HPI: Jesus Boyd is a 75 y.o. male seen for follow up of Acute on Chronic Respiratory Failure.  Patient currently is afebrile without distress at this time  Medications: Reviewed on Rounds  Physical Exam:  Vitals: Temperature is 96.7 pulse 62 respiratory 24 blood pressure is 117/78 saturations 99%  Ventilator Settings on 40 L of oxygen 50%  . General: Comfortable at this time . Eyes: Grossly normal lids, irises & conjunctiva . ENT: grossly tongue is normal . Neck: no obvious mass . Cardiovascular: S1 S2 normal no gallop . Respiratory: Scattered rhonchi coarse breath sounds . Abdomen: soft . Skin: no rash seen on limited exam . Musculoskeletal: not rigid . Psychiatric:unable to assess . Neurologic: no seizure no involuntary movements         Lab Data:   Basic Metabolic Panel: Recent Labs  Lab 10/01/20 0428 10/02/20 0808 10/03/20 0548 10/03/20 1453 10/04/20 0409 10/05/20 0516  NA 146* 143 143  --   --  142  K 5.3* 4.8 6.4* 5.5* 5.1 5.0  CL 113* 108 107  --   --  106  CO2 '24 24 24  '$ --   --  24  GLUCOSE 222* 170* 309*  --   --  320*  BUN 110* 118* 120*  --   --  105*  CREATININE 4.76* 4.51* 4.28*  --   --  3.45*  CALCIUM 10.8* 11.3* 11.4*  --   --  11.4*  MG 2.5*  --  2.0  --   --  1.9  PHOS  --   --  4.2  --   --  4.1    ABG: Recent Labs  Lab 10/06/20 0928  PHART 7.414  PCO2ART 42.1  PO2ART 52.6*  HCO3 26.5  O2SAT 83.2    Liver Function Tests: Recent Labs  Lab 10/03/20 0548 10/05/20 0516  ALBUMIN 2.9* 2.6*   No results for input(s): LIPASE, AMYLASE in the last 168 hours. No results for input(s): AMMONIA in the last 168 hours.  CBC: Recent Labs  Lab 10/01/20 0428 10/03/20 0548 10/05/20 0516   WBC 5.6 3.1* 3.2*  HGB 9.7* 10.0* 8.9*  HCT 27.2* 27.0* 24.0*  MCV 93.5 92.8 89.2  PLT 96* 90* 95*    Cardiac Enzymes: No results for input(s): CKTOTAL, CKMB, CKMBINDEX, TROPONINI in the last 168 hours.  BNP (last 3 results) No results for input(s): BNP in the last 8760 hours.  ProBNP (last 3 results) No results for input(s): PROBNP in the last 8760 hours.  Radiological Exams: DG Chest Port 1 View  Result Date: 10/06/2020 CLINICAL DATA:  75 year old male with history of COVID pneumonia with shortness of breath. EXAM: PORTABLE CHEST 1 VIEW COMPARISON:  Chest x-ray 09/29/2020. FINDINGS: There is a right upper extremity PICC with tip terminating in the right atrium. A nasogastric tube is seen extending into the stomach, however, the tip of the nasogastric tube extends below the lower margin of the image. Lung volumes are normal. Diffuse patchy interstitial and airspace disease again noted throughout the lungs bilaterally, with no significant improvement in aeration compared to the prior examination. Possible trace bilateral pleural effusions. No pneumothorax. Pulmonary vasculature is obscured. Heart size is mildly enlarged. Upper mediastinal contours are within normal limits. Aortic atherosclerosis. 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 IMPRESSION: 1. Support apparatus, as above. 2. Persistent severe multilobar bilateral pneumonia without substantial change compared to the prior study, as above. Possible trace bilateral pleural effusions. 3. Mild cardiomegaly. 4. Aortic atherosclerosis. Electronically Signed   By: Vinnie Langton M.D.   On: 10/06/2020 08:03    Assessment/Plan Active Problems:   Acute on chronic respiratory failure with hypoxia (HCC)   COVID-19 virus infection   Chronic atrial fibrillation (HCC)   Chronic kidney disease, stage IV (severe) (HCC)   Multifocal pneumonia   1. Acute on chronic respiratory failure hypoxia we will continue  with oxygen therapy consider using BiPAP if oxygen requirements continue to go up.  Chest film still showing persistent infiltrate 2. COVID-19 virus infection recovery we will continue to follow along. 3. Chronic atrial fibrillation rate is controlled 4. Chronic kidney disease stage IV severe we will continue to follow 5. Multifocal pneumonia no change we will continue with present management   I have personally seen and evaluated the patient, evaluated laboratory and imaging results, formulated the assessment and plan and placed orders. The Patient requires high complexity decision making with multiple systems involvement.  Rounds were done with the Respiratory Therapy Director and Staff therapists and discussed with nursing staff also.  Allyne Gee, MD [facility] Pulmonary Critical Care Medicine Sleep Medicine

## 2020-10-08 ENCOUNTER — Other Ambulatory Visit (HOSPITAL_COMMUNITY): Payer: Medicare Other

## 2020-10-08 ENCOUNTER — Encounter (HOSPITAL_BASED_OUTPATIENT_CLINIC_OR_DEPARTMENT_OTHER): Payer: Medicare Other

## 2020-10-08 DIAGNOSIS — M79606 Pain in leg, unspecified: Secondary | ICD-10-CM

## 2020-10-08 DIAGNOSIS — J9621 Acute and chronic respiratory failure with hypoxia: Secondary | ICD-10-CM | POA: Diagnosis not present

## 2020-10-08 DIAGNOSIS — N184 Chronic kidney disease, stage 4 (severe): Secondary | ICD-10-CM | POA: Diagnosis not present

## 2020-10-08 DIAGNOSIS — U071 COVID-19: Secondary | ICD-10-CM | POA: Diagnosis not present

## 2020-10-08 DIAGNOSIS — I482 Chronic atrial fibrillation, unspecified: Secondary | ICD-10-CM | POA: Diagnosis not present

## 2020-10-08 LAB — CBC
HCT: 26.7 % — ABNORMAL LOW (ref 39.0–52.0)
Hemoglobin: 8.7 g/dL — ABNORMAL LOW (ref 13.0–17.0)
MCH: 28.6 pg (ref 26.0–34.0)
MCHC: 32.6 g/dL (ref 30.0–36.0)
MCV: 87.8 fL (ref 80.0–100.0)
Platelets: 103 10*3/uL — ABNORMAL LOW (ref 150–400)
RBC: 3.04 MIL/uL — ABNORMAL LOW (ref 4.22–5.81)
RDW: 25.8 % — ABNORMAL HIGH (ref 11.5–15.5)
WBC: 12 10*3/uL — ABNORMAL HIGH (ref 4.0–10.5)
nRBC: 0.9 % — ABNORMAL HIGH (ref 0.0–0.2)

## 2020-10-08 LAB — PROTIME-INR
INR: 2.7 — ABNORMAL HIGH (ref 0.8–1.2)
Prothrombin Time: 27.5 seconds — ABNORMAL HIGH (ref 11.4–15.2)

## 2020-10-08 LAB — ECHOCARDIOGRAM COMPLETE
AR max vel: 3.25 cm2
AV Area VTI: 3.55 cm2
AV Area mean vel: 3.89 cm2
AV Mean grad: 13 mmHg
AV Peak grad: 27 mmHg
Ao pk vel: 2.6 m/s
Area-P 1/2: 6.54 cm2
MV VTI: 4.09 cm2
S' Lateral: 3.3 cm

## 2020-10-08 LAB — VANCOMYCIN, TROUGH: Vancomycin Tr: 17 ug/mL (ref 15–20)

## 2020-10-08 LAB — BASIC METABOLIC PANEL
Anion gap: 11 (ref 5–15)
BUN: 127 mg/dL — ABNORMAL HIGH (ref 8–23)
CO2: 25 mmol/L (ref 22–32)
Calcium: 11.2 mg/dL — ABNORMAL HIGH (ref 8.9–10.3)
Chloride: 112 mmol/L — ABNORMAL HIGH (ref 98–111)
Creatinine, Ser: 3.87 mg/dL — ABNORMAL HIGH (ref 0.61–1.24)
GFR, Estimated: 16 mL/min — ABNORMAL LOW (ref >60)
Glucose, Bld: 118 mg/dL — ABNORMAL HIGH (ref 70–99)
Potassium: 5.1 mmol/L (ref 3.5–5.1)
Sodium: 148 mmol/L — ABNORMAL HIGH (ref 135–145)

## 2020-10-08 LAB — MAGNESIUM: Magnesium: 2.2 mg/dL (ref 1.7–2.4)

## 2020-10-08 NOTE — Progress Notes (Signed)
Pulmonary Critical Care Medicine [place]   PULMONARY CRITICAL CARE SERVICE  PROGRESS NOTE  Date of Service: 10/08/2020  Jesus Boyd  [id number]  DOB: 1946/05/29   DOA: 09/27/2020  Referring Physician: Merton Border, MD  HPI: Jesus Boyd is a 76 y.o. male seen for follow up of Acute on Chronic Respiratory Failure.  He is now down to 50% FiO2 still needing a higher flow rates  Medications: Reviewed on Rounds  Physical Exam:  Vitals: Temperature is 97.9 pulse 89 respiratory rate 37 blood pressure is 116/60 saturations 100%  Ventilator Settings on heated high flow 40 L FiO2 50%  . General: Comfortable at this time . Eyes: Grossly normal lids, irises & conjunctiva . ENT: grossly tongue is normal . Neck: no obvious mass . Cardiovascular: S1 S2 normal no gallop . Respiratory: No rhonchi very coarse breath sound . Abdomen: soft . Skin: no rash seen on limited exam . Musculoskeletal: not rigid . Psychiatric:unable to assess . Neurologic: no seizure no involuntary movements         Lab Data:   Basic Metabolic Panel: Recent Labs  Lab 10/02/20 0808 10/03/20 0548 10/03/20 1453 10/04/20 0409 10/05/20 0516 10/08/20 0451  NA 143 143  --   --  142 148*  K 4.8 6.4* 5.5* 5.1 5.0 5.1  CL 108 107  --   --  106 112*  CO2 24 24  --   --  24 25  GLUCOSE 170* 309*  --   --  320* 118*  BUN 118* 120*  --   --  105* 127*  CREATININE 4.51* 4.28*  --   --  3.45* 3.87*  CALCIUM 11.3* 11.4*  --   --  11.4* 11.2*  MG  --  2.0  --   --  1.9 2.2  PHOS  --  4.2  --   --  4.1  --     ABG: Recent Labs  Lab 10/06/20 0928  PHART 7.414  PCO2ART 42.1  PO2ART 52.6*  HCO3 26.5  O2SAT 83.2    Liver Function Tests: Recent Labs  Lab 10/03/20 0548 10/05/20 0516  ALBUMIN 2.9* 2.6*   No results for input(s): LIPASE, AMYLASE in the last 168 hours. No results for input(s): AMMONIA in the last 168 hours.  CBC: Recent Labs  Lab 10/03/20 0548 10/05/20 0516  10/08/20 0451  WBC 3.1* 3.2* 12.0*  HGB 10.0* 8.9* 8.7*  HCT 27.0* 24.0* 26.7*  MCV 92.8 89.2 87.8  PLT 90* 95* 103*    Cardiac Enzymes: No results for input(s): CKTOTAL, CKMB, CKMBINDEX, TROPONINI in the last 168 hours.  BNP (last 3 results) No results for input(s): BNP in the last 8760 hours.  ProBNP (last 3 results) No results for input(s): PROBNP in the last 8760 hours.  Radiological Exams: No results found.  Assessment/Plan Active Problems:   Acute on chronic respiratory failure with hypoxia (HCC)   COVID-19 virus infection   Chronic atrial fibrillation (HCC)   Chronic kidney disease, stage IV (severe) (HCC)   Multifocal pneumonia   1. Acute on chronic respiratory failure hypoxia plan is to try to titrate the oxygen down saturations with good right now 2. COVID-19 virus infection recovery 3. Chronic atrial fibrillation rate is controlled 4. Chronic kidney disease stage IV we will continue to monitor 5. Multifocal pneumonia overall no change   I have personally seen and evaluated the patient, evaluated laboratory and imaging results, formulated the assessment and plan and placed orders. The  Patient requires high complexity decision making with multiple systems involvement.  Rounds were done with the Respiratory Therapy Director and Staff therapists and discussed with nursing staff also.  Allyne Gee, MD [facility] Pulmonary Critical Care Medicine Sleep Medicine

## 2020-10-08 NOTE — Progress Notes (Signed)
[facility]  ROUNDING NOTE   Subjective:  Serum sodium is risen again up to 148. Patient currently on half-normal saline at 100 cc/h. BUN also higher at 127. Urine output 950 cc over the preceding 24 hours.    Objective:  Vital signs in last 24 hours:  Temperature 97.9 pulse 84 respirations 37 blood pressure 116/60  Physical Exam: General:  Critically ill-appearing  Head:  Normocephalic, atraumatic.  Dry oral mucosa  Eyes:  Anicteric  Neck:  Supple  Lungs:   Scattered rhonchi, normal effort  Heart:  S1S2 irregular  Abdomen:   Soft, nontender, bowel sounds present  Extremities:  No peripheral edema.  Neurologic:  Lethargic, arousable  Skin:  No acute rash  Access:  No functional hemodialysis access    Basic Metabolic Panel: Recent Labs  Lab 10/02/20 0808 10/03/20 0548 10/03/20 1453 10/04/20 0409 10/05/20 0516 10/08/20 0451  NA 143 143  --   --  142 148*  K 4.8 6.4* 5.5* 5.1 5.0 5.1  CL 108 107  --   --  106 112*  CO2 24 24  --   --  24 25  GLUCOSE 170* 309*  --   --  320* 118*  BUN 118* 120*  --   --  105* 127*  CREATININE 4.51* 4.28*  --   --  3.45* 3.87*  CALCIUM 11.3* 11.4*  --   --  11.4* 11.2*  MG  --  2.0  --   --  1.9 2.2  PHOS  --  4.2  --   --  4.1  --     Liver Function Tests: Recent Labs  Lab 10/03/20 0548 10/05/20 0516  ALBUMIN 2.9* 2.6*   No results for input(s): LIPASE, AMYLASE in the last 168 hours. No results for input(s): AMMONIA in the last 168 hours.  CBC: Recent Labs  Lab 10/03/20 0548 10/05/20 0516 10/08/20 0451  WBC 3.1* 3.2* 12.0*  HGB 10.0* 8.9* 8.7*  HCT 27.0* 24.0* 26.7*  MCV 92.8 89.2 87.8  PLT 90* 95* 103*    Cardiac Enzymes: No results for input(s): CKTOTAL, CKMB, CKMBINDEX, TROPONINI in the last 168 hours.  BNP: Invalid input(s): POCBNP  CBG: No results for input(s): GLUCAP in the last 168 hours.  Microbiology: No results found for this or any previous visit.  Coagulation Studies: Recent  Labs    10/06/20 0336 10/07/20 0448 10/08/20 0451  LABPROT 26.6* 26.1* 27.5*  INR 2.6* 2.5* 2.7*    Urinalysis: No results for input(s): COLORURINE, LABSPEC, PHURINE, GLUCOSEU, HGBUR, BILIRUBINUR, KETONESUR, PROTEINUR, UROBILINOGEN, NITRITE, LEUKOCYTESUR in the last 72 hours.  Invalid input(s): APPERANCEUR    Imaging: No results found.   Medications:       Assessment/ Plan:  75 y.o. 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  1.  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  BUN and creatinine both rising again.  Back on half-normal saline at 100 cc/h.  Also increase free water flush to 50 cc every 2 hours.  2.  Hyperkalemia.  Appears stabilized on Lokelma.  Potassium 5.1.  3.  Anemia of chronic kidney disease.  Hemoglobin currently 8.7.  Continue to monitor periodically.  4.  Hypernatremia.  Serum sodium back up to 148.  Increase free water flush to 50 cc every 2 hours.    LOS: 0 Jesus Boyd 2/28/20228:15 AM

## 2020-10-08 NOTE — Progress Notes (Signed)
  Echocardiogram 2D Echocardiogram has been performed.  Jesus Boyd 10/08/2020, 10:26 AM

## 2020-10-08 NOTE — Consult Note (Signed)
Referring Physician: Dr. Leonette Monarch Jesus Boyd is an 75 y.o. male.                       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  Past medical history: As per HPI.   Past Surgical History: . AORTIC VALVE REPLACEMENT  . ARTERIOVENOUS surgery creation of A-V Fistula  . CARDIAC VALVE REPLACEMENT  mitral  . CATARACT EXTRACTION W/ INTRAOCULAR LENS IMPLANT Right 07/09/2016  . CATARACT EXTRACTION W/ INTRAOCULAR LENS IMPLANT Left 04/15/2017  . CATARACT EXTRACTION W/ INTRAOCULAR LENS IMPLANT Left 04/15/2017  . COLONOSCOPY  . ESOPHAGOGASTRODUODENOSCOPY  . EYE SURGERY  . KIDNEY TRANSPLANT Right 2007  . NEPHRECTOMY  . PARATHYROIDECTOMY N/A 12/23/2017   Allergies: No known drug allergies  Social History: . Smoking status: Former Smoker  Packs/day: 0.50  Years: 50.00  Pack years: 25.00  Quit date: 04/10/2012  Years since quitting: 8.4  . Smokeless tobacco: Never Used  Substance and Sexual Activity  . Alcohol use: Not Currently  . Drug use: Not Currently   Family History: . Diabetes Mother  . Hypertension Mother  . Kidney disease Mother  . Cancer Father  . No Known Problems Sister  . Thyroid disease Sister  . Hypertension Brother  . Thyroid disease Brother  . Glaucoma Neg Hx  . Macular degeneration Neg Hx    No medications prior to admission.    Results for orders placed or performed during the hospital encounter of 09/24/2020 (from the past 48 hour(s))  Protime-INR     Status: Abnormal   Collection Time: 10/07/20  4:48 AM  Result Value Ref Range   Prothrombin Time 26.1 (H) 11.4 - 15.2 seconds   INR 2.5 (H) 0.8 - 1.2    Comment: (NOTE) INR goal varies  based on device and disease states. Performed at [facility], [place] [address] [place], [place] [address]   Protime-INR     Status: Abnormal   Collection Time: 10/08/20  4:51 AM  Result Value Ref Range   Prothrombin Time 27.5 (H) 11.4 - 15.2 seconds   INR 2.7 (H) 0.8 - 1.2    Comment: (NOTE) INR goal varies based on device and disease states. Performed at [facility], [place] [address], [place], [place] [address]   Vancomycin, trough     Status: None   Collection Time: 10/08/20  4:51 AM  Result Value Ref Range   Vancomycin Tr 17 15 - 20 ug/mL    Comment: Performed at [facility] [address] [place], [place] [address]  CBC     Status: Abnormal   Collection Time: 10/08/20  4:51 AM  Result Value Ref Range   WBC 12.0 (H) 4.0 - 10.5 K/uL   RBC 3.04 (L) 4.22 - 5.81 MIL/uL   Hemoglobin 8.7 (L) 13.0 - 17.0 g/dL   HCT 26.7 (L) 39.0 - 52.0 %   MCV 87.8 80.0 - 100.0 fL   MCH 28.6 26.0 - 34.0 pg   MCHC 32.6 30.0 - 36.0 g/dL   RDW 25.8 (H) 11.5 - 15.5 %   Platelets 103 (L) 150 - 400 K/uL    Comment: Immature  Platelet Fraction may be clinically indicated, consider ordering this additional test [id number] CONSISTENT WITH PREVIOUS RESULT REPEATED TO VERIFY    nRBC 0.9 (H) 0.0 - 0.2 %    Comment: Performed at [facility], [place] [address], [place], [place] [id number]  Basic metabolic panel     Status: Abnormal   Collection Time: 10/08/20  4:51 AM  Result Value Ref Range   Sodium 148 (H) 135 - 145 mmol/L   Potassium 5.1 3.5 - 5.1 mmol/L   Chloride 112 (H) 98 - 111 mmol/L   CO2 25 22 - 32 mmol/L   Glucose, Bld 118 (H) 70 - 99 mg/dL    Comment: Glucose reference range applies only to samples taken after fasting for at least 8 hours.   BUN 127 (H) 8 - 23 mg/dL   Creatinine, Ser 3.87 (H) 0.61 - 1.24 mg/dL   Calcium 11.2 (H) 8.9 - 10.3 mg/dL   GFR, Estimated 16 (L) >60 mL/min    Comment: (NOTE) Calculated using the CKD-EPI Creatinine Equation (2021)    Anion  gap 11 5 - 15    Comment: Performed at [facility] [address] [place], [place] [address]  Magnesium     Status: None   Collection Time: 10/08/20  4:51 AM  Result Value Ref Range   Magnesium 2.2 1.7 - 2.4 mg/dL    Comment: Performed at [facility] [address], [place], [place] [address]   VAS [place] ABI WITH/WO TBI  Result Date: 10/08/2020 LOWER EXTREMITY DOPPLER STUDY Indications: Rest pain.  Comparison Study: no prior Performing Technologist: Abram Sander RVS  Examination Guidelines: A complete evaluation includes at minimum, Doppler waveform signals and systolic blood pressure reading at the level of bilateral brachial, anterior tibial, and posterior tibial arteries, when vessel segments are accessible. Bilateral testing is considered an integral part of a complete examination. Photoelectric Plethysmograph (PPG) waveforms and toe systolic pressure readings are included as required and additional duplex testing as needed. Limited examinations for reoccurring indications may be performed as noted.  ABI Findings: +---------+------------------+-----+--------+-------------------+ Right    Rt Pressure (mmHg)IndexWaveformComment             +---------+------------------+-----+--------+-------------------+ Brachial                        biphasicresticted extremity +---------+------------------+-----+--------+-------------------+ PTA                             absent                      +---------+------------------+-----+--------+-------------------+ DP                              absent                      +---------+------------------+-----+--------+-------------------+ Great Toe                       Absent                      +---------+------------------+-----+--------+-------------------+ +---------+------------------+-----+--------+--------------+ Left     Lt Pressure (mmHg)IndexWaveformComment         +---------+------------------+-----+--------+--------------+ Brachial  contracted arm +---------+------------------+-----+--------+--------------+ ATA                             absent                 +---------+------------------+-----+--------+--------------+ DP                              absent                 +---------+------------------+-----+--------+--------------+ Great Toe                       Absent                 +---------+------------------+-----+--------+--------------+  Summary: Right: Resting right ankle-brachial index indicates critical limb ischemia. The right toe-brachial index is abnormal. Left: Resting left ankle-brachial index indicates critical left limb ischemia. The left toe-brachial index is abnormal.  *See table(s) above for measurements and observations.  Electronically signed by Servando Snare MD on 10/08/2020 at 5:56:05 PM.   Final    ECHOCARDIOGRAM COMPLETE  Result Date: 10/08/2020    ECHOCARDIOGRAM REPORT   Patient Name:   Jesus Boyd Date of Exam: 10/08/2020 Medical Rec #:  [id number]     Height: Accession #:    [id number]    Weight: Date of Birth:  15-Nov-1945      BSA: Patient Age:    75 years      BP:           105/84 mmHg Patient Gender: M             HR:           91 bpm. Exam Location:  Inpatient Procedure: 2D Echo, Cardiac Doppler and Color Doppler Indications:     CHF  History:         Patient has no prior history of Echocardiogram examinations.                  CHF, COPD and Stroke, Aortic Valve Disease, Mitral Valve                  Disease and MVR, AVR, Arrythmias:Atrial Fibrillation,                  Signs/Symptoms:Alzheimer's; Risk Factors:Diabetes and                  Dyslipidemia. Covid.  Sonographer:     Dustin Flock Referring Phys:  [place] Diagnosing Phys: Dixie Dials MD IMPRESSIONS  1. Mild subcompection seen. Left ventricular ejection fraction, by estimation, is 45 to 50%. The left  ventricle has mildly decreased function. The left ventricle demonstrates regional wall motion abnormalities (see scoring diagram/findings for description). There is severe concentric left ventricular hypertrophy. Left ventricular diastolic parameters are indeterminate. There is moderate hypokinesis of the left ventricular, entire septal wall.  2. Prominent moderatoe bands. Right ventricular systolic function is mildly reduced. The right ventricular size is mildly enlarged. Mildly increased right ventricular wall thickness. There is moderately elevated pulmonary artery systolic pressure.  3. Left atrial size was moderately dilated.  4. Right atrial size was moderately dilated. Cather seen in RA.  5. The pericardial effusion is posterior to the left ventricle.  6. Can not r/o vegetation. The mitral valve has been repaired/replaced. Mild mitral valve regurgitation. No evidence of mitral stenosis. Severe mitral annular calcification.  7. Tricuspid valve regurgitation is severe.  8. Can not r/o vegetation. The aortic valve has been repaired/replaced. There is mild calcification of the aortic valve. There is mild thickening of the aortic valve. Aortic valve regurgitation is mild. Mild aortic valve sclerosis is present, with no evidence of aortic valve stenosis.  9. There is mild (Grade II) atheroma plaque involving the aortic root and ascending aorta. 10. The inferior vena cava is dilated in size with <50% respiratory variability, suggesting right atrial pressure of 15 mmHg. FINDINGS  Left Ventricle: Mild subcompection seen. Left ventricular ejection fraction, by estimation, is 45 to 50%. The left ventricle has mildly decreased function. The left ventricle demonstrates regional wall motion abnormalities. Moderate hypokinesis of the left ventricular, entire septal wall. The left ventricular internal cavity size was normal in size. There is severe concentric left ventricular hypertrophy. Abnormal (paradoxical) septal motion  consistent with post-operative status. Left ventricular diastolic parameters are indeterminate. Right Ventricle: Prominent moderatoe bands. The right ventricular size is mildly enlarged. Mildly increased right ventricular wall thickness. Right ventricular systolic function is mildly reduced. There is moderately elevated pulmonary artery systolic pressure. The tricuspid regurgitant velocity is 3.14 m/s, and with an assumed right atrial pressure of 8 mmHg, the estimated right ventricular systolic pressure is 123XX123 mmHg. Left Atrium: Left atrial size was moderately dilated. Right Atrium: Right atrial size was moderately dilated. Cather seen in RA. Pericardium: Trivial pericardial effusion is present. The pericardial effusion is posterior to the left ventricle. Mitral Valve: Can not r/o vegetation. The mitral valve has been repaired/replaced. Severe mitral annular calcification. Mild mitral valve regurgitation. There is a prosthetic mechanical valve present in the mitral position. No evidence of mitral valve stenosis. MV peak gradient, 11.8 mmHg. The mean mitral valve gradient is 3.0 mmHg. Tricuspid Valve: The tricuspid valve is normal in structure. Tricuspid valve regurgitation is severe. No evidence of tricuspid stenosis. Aortic Valve: Can not r/o vegetation. The aortic valve has been repaired/replaced. There is mild calcification of the aortic valve. There is mild thickening of the aortic valve. There is moderate aortic valve annular calcification. Aortic valve regurgitation is mild. Mild aortic valve sclerosis is present, with no evidence of aortic valve stenosis. Aortic valve mean gradient measures 13.0 mmHg. Aortic valve peak gradient measures 27.0 mmHg. Aortic valve area, by VTI measures 3.55 cm. There is a mechanical valve present in the aortic position. Pulmonic Valve: The pulmonic valve was normal in structure. Pulmonic valve regurgitation is mild. Aorta: The aortic root is normal in size and structure. There  is mild (Grade II) atheroma plaque involving the aortic root and ascending aorta. Venous: The inferior vena cava is dilated in size with less than 50% respiratory variability, suggesting right atrial pressure of 15 mmHg. IAS/Shunts: The interatrial septum was not well visualized.  LEFT VENTRICLE PLAX 2D LVIDd:         4.50 cm  Diastology LVIDs:         3.30 cm  LV e' medial:    5.22 cm/s LV PW:         1.50 cm  LV E/e' medial:  29.5 LV IVS:        1.90 cm  LV e' lateral:   4.03 cm/s LVOT diam:     2.40 cm  LV E/e' lateral: 38.2 LV SV:         142 LVOT Area:     4.52 cm  RIGHT VENTRICLE RV Basal diam:  3.30 cm RV S prime:  8.49 cm/s TAPSE (M-mode): 2.2 cm LEFT ATRIUM            RIGHT ATRIUM LA diam:      6.50 cm  RA Area:     16.50 cm LA Vol (A2C): 109.0 ml RA Volume:   42.90 ml LA Vol (A4C): 49.3 ml  AORTIC VALVE AV Area (Vmax):    3.25 cm AV Area (Vmean):   3.89 cm AV Area (VTI):     3.55 cm AV Vmax:           260.00 cm/s AV Vmean:          163.000 cm/s AV VTI:            0.400 m AV Peak Grad:      27.0 mmHg AV Mean Grad:      13.0 mmHg LVOT Vmax:         187.00 cm/s LVOT Vmean:        140.000 cm/s LVOT VTI:          0.314 m LVOT/AV VTI ratio: 0.78  AORTA Ao Root diam: 3.80 cm MITRAL VALVE                TRICUSPID VALVE MV Area (PHT): 6.54 cm     TR Peak grad:   39.4 mmHg MV Area VTI:   4.09 cm     TR Vmax:        314.00 cm/s MV Peak grad:  11.8 mmHg MV Mean grad:  3.0 mmHg     SHUNTS MV Vmax:       1.72 m/s     Systemic VTI:  0.31 m MV Vmean:      77.2 cm/s    Systemic Diam: 2.40 cm MV Decel Time: 116 msec MV E velocity: 154.00 cm/s MV A velocity: 66.30 cm/s MV E/A ratio:  2.32 Dixie Dials MD Electronically signed by Dixie Dials MD Signature Date/Time: 10/08/2020/2:39:00 PM    Final     Review Of Systems As per HPI  P: 90's, R: 30, BP: 100/54, O2 st 99 % at 30 L high flow. There were no vitals taken for this visit. There is no height or weight on file to calculate BMI. General appearance: awake,  cooperative, appears stated age and moderate respiratory distress Head: Normocephalic, atraumatic. Eyes: Brown eyes, pale conjunctiva, corneas clear. Neck: No adenopathy, no carotid bruit, full JVD, supple. Resp: Clearing to auscultation bilaterally. Cardio: Irregular rate and rhythm, S1, S2 metallic, III/VI systolic murmur, no click, rub or gallop GI: Soft, bowel sounds normal. Extremities: No edema, cyanosis or clubbing. Skin: Warm and dry.  Neurologic: Alert and oriented X 1.  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  Time spent: Review of old records, Lab, x-rays, EKG, other cardiac tests, examination, discussion with patient/Nurse/Doctor over 70 minutes.  Birdie Riddle, MD  10/08/2020, 9:50 PM

## 2020-10-08 NOTE — Progress Notes (Signed)
ABI has been completed.   Preliminary results in CV Proc.  Critical results given to RN.   Jesus Boyd 10/08/2020 10:51 AM

## 2020-10-09 ENCOUNTER — Other Ambulatory Visit (HOSPITAL_COMMUNITY): Payer: Medicare Other

## 2020-10-09 DIAGNOSIS — N184 Chronic kidney disease, stage 4 (severe): Secondary | ICD-10-CM | POA: Diagnosis not present

## 2020-10-09 DIAGNOSIS — I482 Chronic atrial fibrillation, unspecified: Secondary | ICD-10-CM | POA: Diagnosis not present

## 2020-10-09 DIAGNOSIS — U071 COVID-19: Secondary | ICD-10-CM | POA: Diagnosis not present

## 2020-10-09 DIAGNOSIS — J9621 Acute and chronic respiratory failure with hypoxia: Secondary | ICD-10-CM | POA: Diagnosis not present

## 2020-10-09 LAB — RENAL FUNCTION PANEL
Albumin: 2.2 g/dL — ABNORMAL LOW (ref 3.5–5.0)
Anion gap: 13 (ref 5–15)
BUN: 129 mg/dL — ABNORMAL HIGH (ref 8–23)
CO2: 23 mmol/L (ref 22–32)
Calcium: 10.5 mg/dL — ABNORMAL HIGH (ref 8.9–10.3)
Chloride: 106 mmol/L (ref 98–111)
Creatinine, Ser: 3.89 mg/dL — ABNORMAL HIGH (ref 0.61–1.24)
GFR, Estimated: 15 mL/min — ABNORMAL LOW (ref >60)
Glucose, Bld: 191 mg/dL — ABNORMAL HIGH (ref 70–99)
Phosphorus: 3.8 mg/dL (ref 2.5–4.6)
Potassium: 4.3 mmol/L (ref 3.5–5.1)
Sodium: 142 mmol/L (ref 135–145)

## 2020-10-09 LAB — CBC
HCT: 24 % — ABNORMAL LOW (ref 39.0–52.0)
Hemoglobin: 8.1 g/dL — ABNORMAL LOW (ref 13.0–17.0)
MCH: 29.9 pg (ref 26.0–34.0)
MCHC: 33.8 g/dL (ref 30.0–36.0)
MCV: 88.6 fL (ref 80.0–100.0)
Platelets: 97 10*3/uL — ABNORMAL LOW (ref 150–400)
RBC: 2.71 MIL/uL — ABNORMAL LOW (ref 4.22–5.81)
RDW: 25.7 % — ABNORMAL HIGH (ref 11.5–15.5)
WBC: 14.1 10*3/uL — ABNORMAL HIGH (ref 4.0–10.5)
nRBC: 0.8 % — ABNORMAL HIGH (ref 0.0–0.2)

## 2020-10-09 LAB — TACROLIMUS LEVEL: Tacrolimus (FK506) - LabCorp: 6.6 ng/mL (ref 2.0–20.0)

## 2020-10-09 LAB — PROTIME-INR
INR: 2.8 — ABNORMAL HIGH (ref 0.8–1.2)
Prothrombin Time: 28.2 seconds — ABNORMAL HIGH (ref 11.4–15.2)

## 2020-10-09 LAB — MAGNESIUM: Magnesium: 2.1 mg/dL (ref 1.7–2.4)

## 2020-10-09 NOTE — Progress Notes (Signed)
Pulmonary Critical Care Medicine [place]   PULMONARY CRITICAL CARE SERVICE  PROGRESS NOTE  Date of Service: 10/09/2020  Gianpaolo Mcmullen  [id number]  DOB: 08-Jul-1946   DOA: 09/18/2020  Referring Physician: Merton Border, MD  HPI: Jesus Boyd is a 75 y.o. male seen for follow up of Acute on Chronic Respiratory Failure.  Patient is on heated high flow 30 L flow rate and oxygen was weaned down to 35%  Medications: Reviewed on Rounds  Physical Exam:  Vitals: Temperature is 97.0 pulse 103 respiratory rate 30 blood pressure is 83/55 saturations 99%  Ventilator Settings on heated high flow oxygen  . General: Comfortable at this time . Eyes: Grossly normal lids, irises & conjunctiva . ENT: grossly tongue is normal . Neck: no obvious mass . Cardiovascular: S1 S2 normal no gallop . Respiratory: No rhonchi very coarse breath sound . Abdomen: soft . Skin: no rash seen on limited exam . Musculoskeletal: not rigid . Psychiatric:unable to assess . Neurologic: no seizure no involuntary movements         Lab Data:   Basic Metabolic Panel: Recent Labs  Lab 10/03/20 0548 10/03/20 1453 10/04/20 0409 10/05/20 0516 10/08/20 0451 10/09/20 0417  NA 143  --   --  142 148* 142  K 6.4* 5.5* 5.1 5.0 5.1 4.3  CL 107  --   --  106 112* 106  CO2 24  --   --  '24 25 23  '$ GLUCOSE 309*  --   --  320* 118* 191*  BUN 120*  --   --  105* 127* 129*  CREATININE 4.28*  --   --  3.45* 3.87* 3.89*  CALCIUM 11.4*  --   --  11.4* 11.2* 10.5*  MG 2.0  --   --  1.9 2.2 2.1  PHOS 4.2  --   --  4.1  --  3.8    ABG: Recent Labs  Lab 10/06/20 0928  PHART 7.414  PCO2ART 42.1  PO2ART 52.6*  HCO3 26.5  O2SAT 83.2    Liver Function Tests: Recent Labs  Lab 10/03/20 0548 10/05/20 0516 10/09/20 0417  ALBUMIN 2.9* 2.6* 2.2*   No results for input(s): LIPASE, AMYLASE in the last 168 hours. No results for input(s): AMMONIA in the last 168 hours.  CBC: Recent Labs  Lab  10/03/20 0548 10/05/20 0516 10/08/20 0451 10/09/20 0417  WBC 3.1* 3.2* 12.0* 14.1*  HGB 10.0* 8.9* 8.7* 8.1*  HCT 27.0* 24.0* 26.7* 24.0*  MCV 92.8 89.2 87.8 88.6  PLT 90* 95* 103* 97*    Cardiac Enzymes: No results for input(s): CKTOTAL, CKMB, CKMBINDEX, TROPONINI in the last 168 hours.  BNP (last 3 results) No results for input(s): BNP in the last 8760 hours.  ProBNP (last 3 results) No results for input(s): PROBNP in the last 8760 hours.  Radiological Exams: DG CHEST PORT 1 VIEW  Result Date: 10/09/2020 CLINICAL DATA:  Pneumonia EXAM: PORTABLE CHEST 1 VIEW COMPARISON:  10/06/2020 FINDINGS: Nasogastric tube extends into the upper abdomen. Right upper extremity PICC line tip within the right atrium. Pulmonary insufflation is stable. Diffuse pulmonary infiltrate is stable. No pneumothorax or pleural effusion. 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 Cardiac size is mildly enlarged, unchanged. IMPRESSION: Stable support tubes and lines. Stable diffuse pulmonary infiltrate. Electronically Signed   By: Fidela Salisbury MD   On: 10/09/2020 06:04   VAS [place] ABI WITH/WO TBI  Result Date: 10/08/2020 LOWER EXTREMITY DOPPLER STUDY Indications: Rest pain.  Comparison Study: no prior Performing Technologist: Abram Sander RVS  Examination Guidelines: A complete evaluation includes at minimum, Doppler waveform signals and systolic blood pressure reading at the level of bilateral brachial, anterior tibial, and posterior tibial arteries, when vessel segments are accessible. Bilateral testing is considered an integral part of a complete examination. Photoelectric Plethysmograph (PPG) waveforms and toe systolic pressure readings are included as required and additional duplex testing as needed. Limited examinations for reoccurring indications may be performed as noted.  ABI Findings: +---------+------------------+-----+--------+-------------------+ Right    Rt Pressure  (mmHg)IndexWaveformComment             +---------+------------------+-----+--------+-------------------+ Brachial                        biphasicresticted extremity +---------+------------------+-----+--------+-------------------+ PTA                             absent                      +---------+------------------+-----+--------+-------------------+ DP                              absent                      +---------+------------------+-----+--------+-------------------+ Great Toe                       Absent                      +---------+------------------+-----+--------+-------------------+ +---------+------------------+-----+--------+--------------+ Left     Lt Pressure (mmHg)IndexWaveformComment        +---------+------------------+-----+--------+--------------+ Brachial                                contracted arm +---------+------------------+-----+--------+--------------+ ATA                             absent                 +---------+------------------+-----+--------+--------------+ DP                              absent                 +---------+------------------+-----+--------+--------------+ Great Toe                       Absent                 +---------+------------------+-----+--------+--------------+  Summary: Right: Resting right ankle-brachial index indicates critical limb ischemia. The right toe-brachial index is abnormal. Left: Resting left ankle-brachial index indicates critical left limb ischemia. The left toe-brachial index is abnormal.  *See table(s) above for measurements and observations.  Electronically signed by Servando Snare MD on 10/08/2020 at 5:56:05 PM.   Final    ECHOCARDIOGRAM COMPLETE  Result Date: 10/08/2020    ECHOCARDIOGRAM REPORT   Patient Name:   BRAXLEY BLISSETT Date of Exam: 10/08/2020 Medical Rec #:  [id number]     Height: Accession #:    [id number]    Weight: Date of Birth:  1946-02-01      BSA: Patient  Age:    69 years  BP:           105/84 mmHg Patient Gender: M             HR:           91 bpm. Exam Location:  Inpatient Procedure: 2D Echo, Cardiac Doppler and Color Doppler Indications:     CHF  History:         Patient has no prior history of Echocardiogram examinations.                  CHF, COPD and Stroke, Aortic Valve Disease, Mitral Valve                  Disease and MVR, AVR, Arrythmias:Atrial Fibrillation,                  Signs/Symptoms:Alzheimer's; Risk Factors:Diabetes and                  Dyslipidemia. Covid.  Sonographer:     Dustin Flock Referring Phys:  [place] Diagnosing Phys: Dixie Dials MD IMPRESSIONS  1. Mild subcompection seen. Left ventricular ejection fraction, by estimation, is 45 to 50%. The left ventricle has mildly decreased function. The left ventricle demonstrates regional wall motion abnormalities (see scoring diagram/findings for description). There is severe concentric left ventricular hypertrophy. Left ventricular diastolic parameters are indeterminate. There is moderate hypokinesis of the left ventricular, entire septal wall.  2. Prominent moderatoe bands. Right ventricular systolic function is mildly reduced. The right ventricular size is mildly enlarged. Mildly increased right ventricular wall thickness. There is moderately elevated pulmonary artery systolic pressure.  3. Left atrial size was moderately dilated.  4. Right atrial size was moderately dilated. Cather seen in RA.  5. The pericardial effusion is posterior to the left ventricle.  6. Can not r/o vegetation. The mitral valve has been repaired/replaced. Mild mitral valve regurgitation. No evidence of mitral stenosis. Severe mitral annular calcification.  7. Tricuspid valve regurgitation is severe.  8. Can not r/o vegetation. The aortic valve has been repaired/replaced. There is mild calcification of the aortic valve. There is mild thickening of the aortic valve. Aortic valve regurgitation is mild.  Mild aortic valve sclerosis is present, with no evidence of aortic valve stenosis.  9. There is mild (Grade II) atheroma plaque involving the aortic root and ascending aorta. 10. The inferior vena cava is dilated in size with <50% respiratory variability, suggesting right atrial pressure of 15 mmHg. FINDINGS  Left Ventricle: Mild subcompection seen. Left ventricular ejection fraction, by estimation, is 45 to 50%. The left ventricle has mildly decreased function. The left ventricle demonstrates regional wall motion abnormalities. Moderate hypokinesis of the left ventricular, entire septal wall. The left ventricular internal cavity size was normal in size. There is severe concentric left ventricular hypertrophy. Abnormal (paradoxical) septal motion consistent with post-operative status. Left ventricular diastolic parameters are indeterminate. Right Ventricle: Prominent moderatoe bands. The right ventricular size is mildly enlarged. Mildly increased right ventricular wall thickness. Right ventricular systolic function is mildly reduced. There is moderately elevated pulmonary artery systolic pressure. The tricuspid regurgitant velocity is 3.14 m/s, and with an assumed right atrial pressure of 8 mmHg, the estimated right ventricular systolic pressure is 123XX123 mmHg. Left Atrium: Left atrial size was moderately dilated. Right Atrium: Right atrial size was moderately dilated. Cather seen in RA. Pericardium: Trivial pericardial effusion is present. The pericardial effusion is posterior to the left ventricle. Mitral Valve: Can not r/o vegetation. The mitral valve has  been repaired/replaced. Severe mitral annular calcification. Mild mitral valve regurgitation. There is a prosthetic mechanical valve present in the mitral position. No evidence of mitral valve stenosis. MV peak gradient, 11.8 mmHg. The mean mitral valve gradient is 3.0 mmHg. Tricuspid Valve: The tricuspid valve is normal in structure. Tricuspid valve regurgitation  is severe. No evidence of tricuspid stenosis. Aortic Valve: Can not r/o vegetation. The aortic valve has been repaired/replaced. There is mild calcification of the aortic valve. There is mild thickening of the aortic valve. There is moderate aortic valve annular calcification. Aortic valve regurgitation is mild. Mild aortic valve sclerosis is present, with no evidence of aortic valve stenosis. Aortic valve mean gradient measures 13.0 mmHg. Aortic valve peak gradient measures 27.0 mmHg. Aortic valve area, by VTI measures 3.55 cm. There is a mechanical valve present in the aortic position. Pulmonic Valve: The pulmonic valve was normal in structure. Pulmonic valve regurgitation is mild. Aorta: The aortic root is normal in size and structure. There is mild (Grade II) atheroma plaque involving the aortic root and ascending aorta. Venous: The inferior vena cava is dilated in size with less than 50% respiratory variability, suggesting right atrial pressure of 15 mmHg. IAS/Shunts: The interatrial septum was not well visualized.  LEFT VENTRICLE PLAX 2D LVIDd:         4.50 cm  Diastology LVIDs:         3.30 cm  LV e' medial:    5.22 cm/s LV PW:         1.50 cm  LV E/e' medial:  29.5 LV IVS:        1.90 cm  LV e' lateral:   4.03 cm/s LVOT diam:     2.40 cm  LV E/e' lateral: 38.2 LV SV:         142 LVOT Area:     4.52 cm  RIGHT VENTRICLE RV Basal diam:  3.30 cm RV S prime:     8.49 cm/s TAPSE (M-mode): 2.2 cm LEFT ATRIUM            RIGHT ATRIUM LA diam:      6.50 cm  RA Area:     16.50 cm LA Vol (A2C): 109.0 ml RA Volume:   42.90 ml LA Vol (A4C): 49.3 ml  AORTIC VALVE AV Area (Vmax):    3.25 cm AV Area (Vmean):   3.89 cm AV Area (VTI):     3.55 cm AV Vmax:           260.00 cm/s AV Vmean:          163.000 cm/s AV VTI:            0.400 m AV Peak Grad:      27.0 mmHg AV Mean Grad:      13.0 mmHg LVOT Vmax:         187.00 cm/s LVOT Vmean:        140.000 cm/s LVOT VTI:          0.314 m LVOT/AV VTI ratio: 0.78  AORTA Ao Root  diam: 3.80 cm MITRAL VALVE                TRICUSPID VALVE MV Area (PHT): 6.54 cm     TR Peak grad:   39.4 mmHg MV Area VTI:   4.09 cm     TR Vmax:        314.00 cm/s MV Peak grad:  11.8 mmHg MV Mean grad:  3.0 mmHg  SHUNTS MV Vmax:       1.72 m/s     Systemic VTI:  0.31 m MV Vmean:      77.2 cm/s    Systemic Diam: 2.40 cm MV Decel Time: 116 msec MV E velocity: 154.00 cm/s MV A velocity: 66.30 cm/s MV E/A ratio:  2.32 Dixie Dials MD Electronically signed by Dixie Dials MD Signature Date/Time: 10/08/2020/2:39:00 PM    Final     Assessment/Plan Active Problems:   Acute on chronic respiratory failure with hypoxia (HCC)   COVID-19 virus infection   Chronic atrial fibrillation (HCC)   Chronic kidney disease, stage IV (severe) (HCC)   Multifocal pneumonia   1. Acute on chronic respiratory failure with hypoxia we will continue with oxygen therapy as tolerated continue secretion management pulmonary toilet. 2. COVID-19 virus infection recovery we will continue with supportive care 3. Chronic atrial fibrillation rate is controlled 4. Chronic kidney disease stage IV at baseline we will follow along 5. Multifocal pneumonia no change   I have personally seen and evaluated the patient, evaluated laboratory and imaging results, formulated the assessment and plan and placed orders. The Patient requires high complexity decision making with multiple systems involvement.  Rounds were done with the Respiratory Therapy Director and Staff therapists and discussed with nursing staff also.  Allyne Gee, MD [facility] Pulmonary Critical Care Medicine Sleep Medicine

## 2020-10-09 NOTE — Consult Note (Signed)
    Vascular and Vein Specialist   Patient name: Jesus Boyd MRN: [id number] DOB: 1946-02-20 Sex: male   HPI: Jesus Boyd is a 75 y.o. male seen in consultation for bilateral lower extremity critical limb ischemia.  He has no family member present.  He is moaning but otherwise unresponsive.  His history is obtained from his chart.  He most recently was admitted to [facility] with Covid pneumonia.  Was intubated and critically ill.  [facility]  He is now extubated.  Has a history of chronic atrial fibrillation congestive heart failure chronic kidney disease COPD and prior stroke.  He had a history of renal transplant in 2007 and is having failing of this currently.  Has a history of aortic and mitral valve prosthetic replacement.  He was noted to have ischemic lower extremities and underwent noninvasive studies yesterday which revealed absent flow at the dorsalis pedis, posterior tibial and peroneal arteries bilaterally.  Vascular surgery is consulted today.  No past medical history on file.  No family history on file.  SOCIAL HISTORY: Social History   Tobacco Use  . Smoking status: Not on file  . Smokeless tobacco: Not on file  Substance Use Topics  . Alcohol use: Not on file    Not on File  No current facility-administered medications for this encounter.    REVIEW OF SYSTEMS:  Reviewed in his chart with nothing to add PHYSICAL EXAM: There were no vitals filed for this visit.  GENERAL: Thin, frail-appearing gentleman in no acute distress.  Not arousable. CARDIOVASCULAR: 2+ radial pulses and 2-3+ popliteal pulses bilaterally.  Absent pedal pulses MUSCULOSKELETAL: There are no major deformities or cyanosis. NEUROLOGIC: No focal weakness or paresthesias are detected.  Difficult to assess due to mental status SKIN: The patient has profound ischemia of both lower extremities.  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 PSYCHIATRIC: Not able to answer questions.  DATA:  Noninvasive studies yesterday reveal absent flow to both lower extremities  MEDICAL ISSUES: Nonsalvageable lower extremities bilaterally.  Palpable popliteal pulses.  Only option is amputation if continued aggressive care is planned.  Would recommend bilateral above-knee amputation.  The patient clearly would have no ability to participate in physical therapy and would be very borderline healing below-knee amputations due to the tissue loss extending above his ankles.  Recommend discussion with family regarding goals of care.  Patient is now with 2 nonviable lower extremities.  His kidney transplant is failing.  Unclear as to his baseline function prior to his Covid admission.  We will not follow actively.  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  Available for bilateral above-knee amputations if this is the family's desire.    Rosetta Posner, MD FACS [facility] [phone]  Note: Portions of this report may have been transcribed using voice recognition software.  Every effort has been made to ensure accuracy; however, inadvertent computerized transcription errors may still be present.

## 2020-10-09 DEATH — deceased

## 2020-10-10 ENCOUNTER — Telehealth: Payer: Self-pay

## 2020-10-10 DIAGNOSIS — J9621 Acute and chronic respiratory failure with hypoxia: Secondary | ICD-10-CM | POA: Diagnosis not present

## 2020-10-10 DIAGNOSIS — U071 COVID-19: Secondary | ICD-10-CM | POA: Diagnosis not present

## 2020-10-10 DIAGNOSIS — N184 Chronic kidney disease, stage 4 (severe): Secondary | ICD-10-CM | POA: Diagnosis not present

## 2020-10-10 DIAGNOSIS — I482 Chronic atrial fibrillation, unspecified: Secondary | ICD-10-CM | POA: Diagnosis not present

## 2020-10-10 LAB — RENAL FUNCTION PANEL
Albumin: 2.2 g/dL — ABNORMAL LOW (ref 3.5–5.0)
Anion gap: 10 (ref 5–15)
BUN: 131 mg/dL — ABNORMAL HIGH (ref 8–23)
CO2: 22 mmol/L (ref 22–32)
Calcium: 10.5 mg/dL — ABNORMAL HIGH (ref 8.9–10.3)
Chloride: 107 mmol/L (ref 98–111)
Creatinine, Ser: 3.67 mg/dL — ABNORMAL HIGH (ref 0.61–1.24)
GFR, Estimated: 17 mL/min — ABNORMAL LOW (ref >60)
Glucose, Bld: 154 mg/dL — ABNORMAL HIGH (ref 70–99)
Phosphorus: 3.6 mg/dL (ref 2.5–4.6)
Potassium: 3.9 mmol/L (ref 3.5–5.1)
Sodium: 139 mmol/L (ref 135–145)

## 2020-10-10 LAB — MAGNESIUM: Magnesium: 2 mg/dL (ref 1.7–2.4)

## 2020-10-10 LAB — CBC
HCT: 23.1 % — ABNORMAL LOW (ref 39.0–52.0)
Hemoglobin: 8 g/dL — ABNORMAL LOW (ref 13.0–17.0)
MCH: 30.5 pg (ref 26.0–34.0)
MCHC: 34.6 g/dL (ref 30.0–36.0)
MCV: 88.2 fL (ref 80.0–100.0)
Platelets: 97 10*3/uL — ABNORMAL LOW (ref 150–400)
RBC: 2.62 MIL/uL — ABNORMAL LOW (ref 4.22–5.81)
RDW: 26.5 % — ABNORMAL HIGH (ref 11.5–15.5)
WBC: 15 10*3/uL — ABNORMAL HIGH (ref 4.0–10.5)
nRBC: 0.3 % — ABNORMAL HIGH (ref 0.0–0.2)

## 2020-10-10 LAB — VANCOMYCIN, TROUGH: Vancomycin Tr: 16 ug/mL (ref 15–20)

## 2020-10-10 LAB — PROTIME-INR
INR: 2.4 — ABNORMAL HIGH (ref 0.8–1.2)
Prothrombin Time: 25 seconds — ABNORMAL HIGH (ref 11.4–15.2)

## 2020-10-10 NOTE — Telephone Encounter (Signed)
Returned Gwendolyn Fill, who is case Technical sales engineer of [employer]. She just met with pt's wife and 3 sons and discussed possible bilateral BKA. They answered as many of family's questions but family has further questions about risks, etc. And would like to speak to MD. I have notified MD.

## 2020-10-10 NOTE — Progress Notes (Signed)
[facility]  ROUNDING NOTE   Subjective:  Patient seen and evaluated at bedside. Still with altered mental status. Serum sodium now down to 139. Creatinine down slightly to 3.67. Patient still making some urine.    Objective:  Vital signs in last 24 hours:  Temperature 97.2 pulse 73 respirations 40 blood pressure 112/53  Physical Exam: General:  Critically ill-appearing  Head:  Normocephalic, atraumatic.  Difficult to assess hearing  Eyes:  Anicteric  Neck:  Supple  Lungs:   Scattered rhonchi, normal effort  Heart:  S1S2 irregular  Abdomen:   Soft, nontender, bowel sounds present  Extremities:  No peripheral edema.  Neurologic:  Lethargic, but arousable  Skin:  No acute rash  Access:  No functional hemodialysis access    Basic Metabolic Panel: Recent Labs  Lab 10/04/20 0409 10/05/20 0516 10/05/20 0516 10/08/20 0451 10/09/20 0417 10/10/20 0511  NA  --  142  --  148* 142 139  K 5.1 5.0  --  5.1 4.3 3.9  CL  --  106  --  112* 106 107  CO2  --  24  --  '25 23 22  '$ GLUCOSE  --  320*  --  118* 191* 154*  BUN  --  105*  --  127* 129* 131*  CREATININE  --  3.45*  --  3.87* 3.89* 3.67*  CALCIUM  --  11.4*   < > 11.2* 10.5* 10.5*  MG  --  1.9  --  2.2 2.1 2.0  PHOS  --  4.1  --   --  3.8 3.6   < > = values in this interval not displayed.    Liver Function Tests: Recent Labs  Lab 10/05/20 0516 10/09/20 0417 10/10/20 0511  ALBUMIN 2.6* 2.2* 2.2*   No results for input(s): LIPASE, AMYLASE in the last 168 hours. No results for input(s): AMMONIA in the last 168 hours.  CBC: Recent Labs  Lab 10/05/20 0516 10/08/20 0451 10/09/20 0417 10/10/20 0511  WBC 3.2* 12.0* 14.1* 15.0*  HGB 8.9* 8.7* 8.1* 8.0*  HCT 24.0* 26.7* 24.0* 23.1*  MCV 89.2 87.8 88.6 88.2  PLT 95* 103* 97* 97*    Cardiac Enzymes: No results for input(s): CKTOTAL, CKMB, CKMBINDEX, TROPONINI in the last 168 hours.  BNP: Invalid input(s): POCBNP  CBG: No results for input(s):  GLUCAP in the last 168 hours.  Microbiology: No results found for this or any previous visit.  Coagulation Studies: Recent Labs    10/08/20 0451 10/09/20 0417 10/10/20 0511  LABPROT 27.5* 28.2* 25.0*  INR 2.7* 2.8* 2.4*    Urinalysis: No results for input(s): COLORURINE, LABSPEC, PHURINE, GLUCOSEU, HGBUR, BILIRUBINUR, KETONESUR, PROTEINUR, UROBILINOGEN, NITRITE, LEUKOCYTESUR in the last 72 hours.  Invalid input(s): APPERANCEUR    Imaging: DG CHEST PORT 1 VIEW  Result Date: 10/09/2020 CLINICAL DATA:  Pneumonia EXAM: PORTABLE CHEST 1 VIEW COMPARISON:  10/06/2020 FINDINGS: Nasogastric tube extends into the upper abdomen. Right upper extremity PICC line tip within the right atrium. Pulmonary insufflation is stable. Diffuse pulmonary infiltrate is stable. No pneumothorax or pleural effusion. 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 Cardiac size is mildly enlarged, unchanged. IMPRESSION: Stable support tubes and lines. Stable diffuse pulmonary infiltrate. Electronically Signed   By: Fidela Salisbury MD   On: 10/09/2020 06:04   VAS [place] ABI WITH/WO TBI  Result Date: 10/08/2020 LOWER EXTREMITY DOPPLER STUDY Indications: Rest pain.  Comparison Study: no prior Performing Technologist: Abram Sander RVS  Examination Guidelines: A complete evaluation  includes at minimum, Doppler waveform signals and systolic blood pressure reading at the level of bilateral brachial, anterior tibial, and posterior tibial arteries, when vessel segments are accessible. Bilateral testing is considered an integral part of a complete examination. Photoelectric Plethysmograph (PPG) waveforms and toe systolic pressure readings are included as required and additional duplex testing as needed. Limited examinations for reoccurring indications may be performed as noted.  ABI Findings: +---------+------------------+-----+--------+-------------------+ Right    Rt Pressure (mmHg)IndexWaveformComment              +---------+------------------+-----+--------+-------------------+ Brachial                        biphasicresticted extremity +---------+------------------+-----+--------+-------------------+ PTA                             absent                      +---------+------------------+-----+--------+-------------------+ DP                              absent                      +---------+------------------+-----+--------+-------------------+ Great Toe                       Absent                      +---------+------------------+-----+--------+-------------------+ +---------+------------------+-----+--------+--------------+ Left     Lt Pressure (mmHg)IndexWaveformComment        +---------+------------------+-----+--------+--------------+ Brachial                                contracted arm +---------+------------------+-----+--------+--------------+ ATA                             absent                 +---------+------------------+-----+--------+--------------+ DP                              absent                 +---------+------------------+-----+--------+--------------+ Great Toe                       Absent                 +---------+------------------+-----+--------+--------------+  Summary: Right: Resting right ankle-brachial index indicates critical limb ischemia. The right toe-brachial index is abnormal. Left: Resting left ankle-brachial index indicates critical left limb ischemia. The left toe-brachial index is abnormal.  *See table(s) above for measurements and observations.  Electronically signed by Servando Snare MD on 10/08/2020 at 5:56:05 PM.   Final    ECHOCARDIOGRAM COMPLETE  Result Date: 10/08/2020    ECHOCARDIOGRAM REPORT   Patient Name:   Jesus Boyd Date of Exam: 10/08/2020 Medical Rec #:  [id number]     Height: Accession #:    [id number]    Weight: Date of Birth:  [birth year]      BSA: Patient Age:    75 years      BP:           105/84  mmHg  Patient Gender: M             HR:           91 bpm. Exam Location:  Inpatient Procedure: 2D Echo, Cardiac Doppler and Color Doppler Indications:     CHF  History:         Patient has no prior history of Echocardiogram examinations.                  CHF, COPD and Stroke, Aortic Valve Disease, Mitral Valve                  Disease and MVR, AVR, Arrythmias:Atrial Fibrillation,                  Signs/Symptoms:Alzheimer's; Risk Factors:Diabetes and                  Dyslipidemia. Covid.  Sonographer:     Dustin Flock Referring Phys:  [place] Diagnosing Phys: Dixie Dials MD IMPRESSIONS  1. Mild subcompection seen. Left ventricular ejection fraction, by estimation, is 45 to 50%. The left ventricle has mildly decreased function. The left ventricle demonstrates regional wall motion abnormalities (see scoring diagram/findings for description). There is severe concentric left ventricular hypertrophy. Left ventricular diastolic parameters are indeterminate. There is moderate hypokinesis of the left ventricular, entire septal wall.  2. Prominent moderatoe bands. Right ventricular systolic function is mildly reduced. The right ventricular size is mildly enlarged. Mildly increased right ventricular wall thickness. There is moderately elevated pulmonary artery systolic pressure.  3. Left atrial size was moderately dilated.  4. Right atrial size was moderately dilated. Cather seen in RA.  5. The pericardial effusion is posterior to the left ventricle.  6. Can not r/o vegetation. The mitral valve has been repaired/replaced. Mild mitral valve regurgitation. No evidence of mitral stenosis. Severe mitral annular calcification.  7. Tricuspid valve regurgitation is severe.  8. Can not r/o vegetation. The aortic valve has been repaired/replaced. There is mild calcification of the aortic valve. There is mild thickening of the aortic valve. Aortic valve regurgitation is mild. Mild aortic valve sclerosis is present, with  no evidence of aortic valve stenosis.  9. There is mild (Grade II) atheroma plaque involving the aortic root and ascending aorta. 10. The inferior vena cava is dilated in size with <50% respiratory variability, suggesting right atrial pressure of 15 mmHg. FINDINGS  Left Ventricle: Mild subcompection seen. Left ventricular ejection fraction, by estimation, is 45 to 50%. The left ventricle has mildly decreased function. The left ventricle demonstrates regional wall motion abnormalities. Moderate hypokinesis of the left ventricular, entire septal wall. The left ventricular internal cavity size was normal in size. There is severe concentric left ventricular hypertrophy. Abnormal (paradoxical) septal motion consistent with post-operative status. Left ventricular diastolic parameters are indeterminate. Right Ventricle: Prominent moderatoe bands. The right ventricular size is mildly enlarged. Mildly increased right ventricular wall thickness. Right ventricular systolic function is mildly reduced. There is moderately elevated pulmonary artery systolic pressure. The tricuspid regurgitant velocity is 3.14 m/s, and with an assumed right atrial pressure of 8 mmHg, the estimated right ventricular systolic pressure is 123XX123 mmHg. Left Atrium: Left atrial size was moderately dilated. Right Atrium: Right atrial size was moderately dilated. Cather seen in RA. Pericardium: Trivial pericardial effusion is present. The pericardial effusion is posterior to the left ventricle. Mitral Valve: Can not r/o vegetation. The mitral valve has been repaired/replaced. Severe mitral annular calcification. Mild mitral valve regurgitation. There is a  prosthetic mechanical valve present in the mitral position. No evidence of mitral valve stenosis. MV peak gradient, 11.8 mmHg. The mean mitral valve gradient is 3.0 mmHg. Tricuspid Valve: The tricuspid valve is normal in structure. Tricuspid valve regurgitation is severe. No evidence of tricuspid  stenosis. Aortic Valve: Can not r/o vegetation. The aortic valve has been repaired/replaced. There is mild calcification of the aortic valve. There is mild thickening of the aortic valve. There is moderate aortic valve annular calcification. Aortic valve regurgitation is mild. Mild aortic valve sclerosis is present, with no evidence of aortic valve stenosis. Aortic valve mean gradient measures 13.0 mmHg. Aortic valve peak gradient measures 27.0 mmHg. Aortic valve area, by VTI measures 3.55 cm. There is a mechanical valve present in the aortic position. Pulmonic Valve: The pulmonic valve was normal in structure. Pulmonic valve regurgitation is mild. Aorta: The aortic root is normal in size and structure. There is mild (Grade II) atheroma plaque involving the aortic root and ascending aorta. Venous: The inferior vena cava is dilated in size with less than 50% respiratory variability, suggesting right atrial pressure of 15 mmHg. IAS/Shunts: The interatrial septum was not well visualized.  LEFT VENTRICLE PLAX 2D LVIDd:         4.50 cm  Diastology LVIDs:         3.30 cm  LV e' medial:    5.22 cm/s LV PW:         1.50 cm  LV E/e' medial:  29.5 LV IVS:        1.90 cm  LV e' lateral:   4.03 cm/s LVOT diam:     2.40 cm  LV E/e' lateral: 38.2 LV SV:         142 LVOT Area:     4.52 cm  RIGHT VENTRICLE RV Basal diam:  3.30 cm RV S prime:     8.49 cm/s TAPSE (M-mode): 2.2 cm LEFT ATRIUM            RIGHT ATRIUM LA diam:      6.50 cm  RA Area:     16.50 cm LA Vol (A2C): 109.0 ml RA Volume:   42.90 ml LA Vol (A4C): 49.3 ml  AORTIC VALVE AV Area (Vmax):    3.25 cm AV Area (Vmean):   3.89 cm AV Area (VTI):     3.55 cm AV Vmax:           260.00 cm/s AV Vmean:          163.000 cm/s AV VTI:            0.400 m AV Peak Grad:      27.0 mmHg AV Mean Grad:      13.0 mmHg LVOT Vmax:         187.00 cm/s LVOT Vmean:        140.000 cm/s LVOT VTI:          0.314 m LVOT/AV VTI ratio: 0.78  AORTA Ao Root diam: 3.80 cm MITRAL VALVE                 TRICUSPID VALVE MV Area (PHT): 6.54 cm     TR Peak grad:   39.4 mmHg MV Area VTI:   4.09 cm     TR Vmax:        314.00 cm/s MV Peak grad:  11.8 mmHg MV Mean grad:  3.0 mmHg     SHUNTS MV Vmax:       1.72 m/s  Systemic VTI:  0.31 m MV Vmean:      77.2 cm/s    Systemic Diam: 2.40 cm MV Decel Time: 116 msec MV E velocity: 154.00 cm/s MV A velocity: 66.30 cm/s MV E/A ratio:  2.32 Dixie Dials MD Electronically signed by Dixie Dials MD Signature Date/Time: 10/08/2020/2:39:00 PM    Final      Medications:       Assessment/ Plan:  75 y.o. 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  1.  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  Creatinine down slightly to 3.67.  Urine output was 900 cc over the preceding 24 hours.  Therefore no immediate need for dialysis at this time.  2.  Hyperkalemia.  Potassium currently 3.9.  Continue periodically monitor.  3.  Anemia of chronic kidney disease.  Hemoglobin 8.0.  May need to consider adding Epogen.  4.  Hypernatremia.  Serum sodium down significantly to 139.  Patient on free water flushes 50 cc every 2 hours.    LOS: 0 Munsoor Lateef 3/2/20228:27 AM

## 2020-10-10 NOTE — Progress Notes (Signed)
Pulmonary Critical Care Medicine [place]   PULMONARY CRITICAL CARE SERVICE  PROGRESS NOTE  Date of Service: 10/10/2020  Jesus Boyd  [id number]  DOB: 30-Jul-1946   DOA: 09/22/2020  Referring Physician: Merton Border, MD  HPI: Jesus Boyd is a 75 y.o. male seen for follow up of Acute on Chronic Respiratory Failure.  Patient is on high flow oxygen right now on 30 L  Medications: Reviewed on Rounds  Physical Exam:  Vitals: Temperature is 97.2 pulse 83 respiratory rate was 32 blood pressure 112/53 saturations 92%  Ventilator Settings on heated high flow FiO2 40% 30 L flow rate  . General: Comfortable at this time . Eyes: Grossly normal lids, irises & conjunctiva . ENT: grossly tongue is normal . Neck: no obvious mass . Cardiovascular: S1 S2 normal no gallop . Respiratory: Scattered rhonchi expansion is equal . Abdomen: soft . Skin: no rash seen on limited exam . Musculoskeletal: not rigid . Psychiatric:unable to assess . Neurologic: no seizure no involuntary movements         Lab Data:   Basic Metabolic Panel: Recent Labs  Lab 10/04/20 0409 10/05/20 0516 10/08/20 0451 10/09/20 0417 10/10/20 0511  NA  --  142 148* 142 139  K 5.1 5.0 5.1 4.3 3.9  CL  --  106 112* 106 107  CO2  --  '24 25 23 22  '$ GLUCOSE  --  320* 118* 191* 154*  BUN  --  105* 127* 129* 131*  CREATININE  --  3.45* 3.87* 3.89* 3.67*  CALCIUM  --  11.4* 11.2* 10.5* 10.5*  MG  --  1.9 2.2 2.1 2.0  PHOS  --  4.1  --  3.8 3.6    ABG: Recent Labs  Lab 10/06/20 0928  PHART 7.414  PCO2ART 42.1  PO2ART 52.6*  HCO3 26.5  O2SAT 83.2    Liver Function Tests: Recent Labs  Lab 10/05/20 0516 10/09/20 0417 10/10/20 0511  ALBUMIN 2.6* 2.2* 2.2*   No results for input(s): LIPASE, AMYLASE in the last 168 hours. No results for input(s): AMMONIA in the last 168 hours.  CBC: Recent Labs  Lab 10/05/20 0516 10/08/20 0451 10/09/20 0417 10/10/20 0511  WBC 3.2* 12.0* 14.1*  15.0*  HGB 8.9* 8.7* 8.1* 8.0*  HCT 24.0* 26.7* 24.0* 23.1*  MCV 89.2 87.8 88.6 88.2  PLT 95* 103* 97* 97*    Cardiac Enzymes: No results for input(s): CKTOTAL, CKMB, CKMBINDEX, TROPONINI in the last 168 hours.  BNP (last 3 results) No results for input(s): BNP in the last 8760 hours.  ProBNP (last 3 results) No results for input(s): PROBNP in the last 8760 hours.  Radiological Exams: DG CHEST PORT 1 VIEW  Result Date: 10/09/2020 CLINICAL DATA:  Pneumonia EXAM: PORTABLE CHEST 1 VIEW COMPARISON:  10/06/2020 FINDINGS: Nasogastric tube extends into the upper abdomen. Right upper extremity PICC line tip within the right atrium. Pulmonary insufflation is stable. Diffuse pulmonary infiltrate is stable. No pneumothorax or pleural effusion. 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 Cardiac size is mildly enlarged, unchanged. IMPRESSION: Stable support tubes and lines. Stable diffuse pulmonary infiltrate. Electronically Signed   By: Jesus Salisbury MD   On: 10/09/2020 06:04   VAS [place] ABI WITH/WO TBI  Result Date: 10/08/2020 LOWER EXTREMITY DOPPLER STUDY Indications: Rest pain.  Comparison Study: no prior Performing Technologist: Jesus Boyd RVS  Examination Guidelines: A complete evaluation includes at minimum, Doppler waveform signals and systolic blood pressure reading at the level of bilateral brachial,  anterior tibial, and posterior tibial arteries, when vessel segments are accessible. Bilateral testing is considered an integral part of a complete examination. Photoelectric Plethysmograph (PPG) waveforms and toe systolic pressure readings are included as required and additional duplex testing as needed. Limited examinations for reoccurring indications may be performed as noted.  ABI Findings: +---------+------------------+-----+--------+-------------------+ Right    Rt Pressure (mmHg)IndexWaveformComment             +---------+------------------+-----+--------+-------------------+  Brachial                        biphasicresticted extremity +---------+------------------+-----+--------+-------------------+ PTA                             absent                      +---------+------------------+-----+--------+-------------------+ DP                              absent                      +---------+------------------+-----+--------+-------------------+ Great Toe                       Absent                      +---------+------------------+-----+--------+-------------------+ +---------+------------------+-----+--------+--------------+ Left     Lt Pressure (mmHg)IndexWaveformComment        +---------+------------------+-----+--------+--------------+ Brachial                                contracted arm +---------+------------------+-----+--------+--------------+ ATA                             absent                 +---------+------------------+-----+--------+--------------+ DP                              absent                 +---------+------------------+-----+--------+--------------+ Great Toe                       Absent                 +---------+------------------+-----+--------+--------------+  Summary: Right: Resting right ankle-brachial index indicates critical limb ischemia. The right toe-brachial index is abnormal. Left: Resting left ankle-brachial index indicates critical left limb ischemia. The left toe-brachial index is abnormal.  *See table(s) above for measurements and observations.  Electronically signed by Jesus Snare MD on 10/08/2020 at 5:56:05 PM.   Final    ECHOCARDIOGRAM COMPLETE  Result Date: 10/08/2020    ECHOCARDIOGRAM REPORT   Patient Name:   Jesus Boyd Date of Exam: 10/08/2020 Medical Rec #:  [id number]     Height: Accession #:    [id number]    Weight: Date of Birth:  Dec 15, 1945      BSA: Patient Age:    27 years      BP:           105/84 mmHg Patient Gender: M             HR:  91 bpm. Exam  Location:  Inpatient Procedure: 2D Echo, Cardiac Doppler and Color Doppler Indications:     CHF  History:         Patient has no prior history of Echocardiogram examinations.                  CHF, COPD and Stroke, Aortic Valve Disease, Mitral Valve                  Disease and MVR, AVR, Arrythmias:Atrial Fibrillation,                  Signs/Symptoms:Alzheimer's; Risk Factors:Diabetes and                  Dyslipidemia. Covid.  Sonographer:     Dustin Flock Referring Phys:  [place] Diagnosing Phys: Dixie Dials MD IMPRESSIONS  1. Mild subcompection seen. Left ventricular ejection fraction, by estimation, is 45 to 50%. The left ventricle has mildly decreased function. The left ventricle demonstrates regional wall motion abnormalities (see scoring diagram/findings for description). There is severe concentric left ventricular hypertrophy. Left ventricular diastolic parameters are indeterminate. There is moderate hypokinesis of the left ventricular, entire septal wall.  2. Prominent moderatoe bands. Right ventricular systolic function is mildly reduced. The right ventricular size is mildly enlarged. Mildly increased right ventricular wall thickness. There is moderately elevated pulmonary artery systolic pressure.  3. Left atrial size was moderately dilated.  4. Right atrial size was moderately dilated. Cather seen in RA.  5. The pericardial effusion is posterior to the left ventricle.  6. Can not r/o vegetation. The mitral valve has been repaired/replaced. Mild mitral valve regurgitation. No evidence of mitral stenosis. Severe mitral annular calcification.  7. Tricuspid valve regurgitation is severe.  8. Can not r/o vegetation. The aortic valve has been repaired/replaced. There is mild calcification of the aortic valve. There is mild thickening of the aortic valve. Aortic valve regurgitation is mild. Mild aortic valve sclerosis is present, with no evidence of aortic valve stenosis.  9. There is mild (Grade  II) atheroma plaque involving the aortic root and ascending aorta. 10. The inferior vena cava is dilated in size with <50% respiratory variability, suggesting right atrial pressure of 15 mmHg. FINDINGS  Left Ventricle: Mild subcompection seen. Left ventricular ejection fraction, by estimation, is 45 to 50%. The left ventricle has mildly decreased function. The left ventricle demonstrates regional wall motion abnormalities. Moderate hypokinesis of the left ventricular, entire septal wall. The left ventricular internal cavity size was normal in size. There is severe concentric left ventricular hypertrophy. Abnormal (paradoxical) septal motion consistent with post-operative status. Left ventricular diastolic parameters are indeterminate. Right Ventricle: Prominent moderatoe bands. The right ventricular size is mildly enlarged. Mildly increased right ventricular wall thickness. Right ventricular systolic function is mildly reduced. There is moderately elevated pulmonary artery systolic pressure. The tricuspid regurgitant velocity is 3.14 m/s, and with an assumed right atrial pressure of 8 mmHg, the estimated right ventricular systolic pressure is 123XX123 mmHg. Left Atrium: Left atrial size was moderately dilated. Right Atrium: Right atrial size was moderately dilated. Cather seen in RA. Pericardium: Trivial pericardial effusion is present. The pericardial effusion is posterior to the left ventricle. Mitral Valve: Can not r/o vegetation. The mitral valve has been repaired/replaced. Severe mitral annular calcification. Mild mitral valve regurgitation. There is a prosthetic mechanical valve present in the mitral position. No evidence of mitral valve stenosis. MV peak gradient, 11.8 mmHg. The mean mitral valve gradient is  3.0 mmHg. Tricuspid Valve: The tricuspid valve is normal in structure. Tricuspid valve regurgitation is severe. No evidence of tricuspid stenosis. Aortic Valve: Can not r/o vegetation. The aortic valve has  been repaired/replaced. There is mild calcification of the aortic valve. There is mild thickening of the aortic valve. There is moderate aortic valve annular calcification. Aortic valve regurgitation is mild. Mild aortic valve sclerosis is present, with no evidence of aortic valve stenosis. Aortic valve mean gradient measures 13.0 mmHg. Aortic valve peak gradient measures 27.0 mmHg. Aortic valve area, by VTI measures 3.55 cm. There is a mechanical valve present in the aortic position. Pulmonic Valve: The pulmonic valve was normal in structure. Pulmonic valve regurgitation is mild. Aorta: The aortic root is normal in size and structure. There is mild (Grade II) atheroma plaque involving the aortic root and ascending aorta. Venous: The inferior vena cava is dilated in size with less than 50% respiratory variability, suggesting right atrial pressure of 15 mmHg. IAS/Shunts: The interatrial septum was not well visualized.  LEFT VENTRICLE PLAX 2D LVIDd:         4.50 cm  Diastology LVIDs:         3.30 cm  LV e' medial:    5.22 cm/s LV PW:         1.50 cm  LV E/e' medial:  29.5 LV IVS:        1.90 cm  LV e' lateral:   4.03 cm/s LVOT diam:     2.40 cm  LV E/e' lateral: 38.2 LV SV:         142 LVOT Area:     4.52 cm  RIGHT VENTRICLE RV Basal diam:  3.30 cm RV S prime:     8.49 cm/s TAPSE (M-mode): 2.2 cm LEFT ATRIUM            RIGHT ATRIUM LA diam:      6.50 cm  RA Area:     16.50 cm LA Vol (A2C): 109.0 ml RA Volume:   42.90 ml LA Vol (A4C): 49.3 ml  AORTIC VALVE AV Area (Vmax):    3.25 cm AV Area (Vmean):   3.89 cm AV Area (VTI):     3.55 cm AV Vmax:           260.00 cm/s AV Vmean:          163.000 cm/s AV VTI:            0.400 m AV Peak Grad:      27.0 mmHg AV Mean Grad:      13.0 mmHg LVOT Vmax:         187.00 cm/s LVOT Vmean:        140.000 cm/s LVOT VTI:          0.314 m LVOT/AV VTI ratio: 0.78  AORTA Ao Root diam: 3.80 cm MITRAL VALVE                TRICUSPID VALVE MV Area (PHT): 6.54 cm     TR Peak grad:   39.4  mmHg MV Area VTI:   4.09 cm     TR Vmax:        314.00 cm/s MV Peak grad:  11.8 mmHg MV Mean grad:  3.0 mmHg     SHUNTS MV Vmax:       1.72 m/s     Systemic VTI:  0.31 m MV Vmean:      77.2 cm/s    Systemic Diam: 2.40 cm MV  Decel Time: 116 msec MV E velocity: 154.00 cm/s MV A velocity: 66.30 cm/s MV E/A ratio:  2.32 Dixie Dials MD Electronically signed by Dixie Dials MD Signature Date/Time: 10/08/2020/2:39:00 PM    Final     Assessment/Plan Active Problems:   Acute on chronic respiratory failure with hypoxia (HCC)   COVID-19 virus infection   Chronic atrial fibrillation (HCC)   Chronic kidney disease, stage IV (severe) (HCC)   Multifocal pneumonia   1. Acute on chronic respiratory failure with hypoxia the plan is to continue to try to titrate the oxygen levels down patient has had slight increase in requirements over the last 24 hours 2. Chronic atrial fibrillation rate controlled medical management 3. COVID-19 virus infection recovery phase still on high oxygen requirements. 4. Chronic kidney disease stage IV supportive care 5. Multifocal pneumonia no change   I have personally seen and evaluated the patient, evaluated laboratory and imaging results, formulated the assessment and plan and placed orders. The Patient requires high complexity decision making with multiple systems involvement.  Rounds were done with the Respiratory Therapy Director and Staff therapists and discussed with nursing staff also.  Allyne Gee, MD [facility] Pulmonary Critical Care Medicine Sleep Medicine

## 2020-10-11 ENCOUNTER — Telehealth: Payer: Self-pay | Admitting: Vascular Surgery

## 2020-10-11 DIAGNOSIS — J9621 Acute and chronic respiratory failure with hypoxia: Secondary | ICD-10-CM | POA: Diagnosis not present

## 2020-10-11 DIAGNOSIS — N184 Chronic kidney disease, stage 4 (severe): Secondary | ICD-10-CM | POA: Diagnosis not present

## 2020-10-11 DIAGNOSIS — U071 COVID-19: Secondary | ICD-10-CM | POA: Diagnosis not present

## 2020-10-11 DIAGNOSIS — I482 Chronic atrial fibrillation, unspecified: Secondary | ICD-10-CM | POA: Diagnosis not present

## 2020-10-11 LAB — CBC
HCT: 23.5 % — ABNORMAL LOW (ref 39.0–52.0)
HCT: 23.2 % — ABNORMAL LOW (ref 39.0–52.0)
Hemoglobin: 7.8 g/dL — ABNORMAL LOW (ref 13.0–17.0)
Hemoglobin: 7.9 g/dL — ABNORMAL LOW (ref 13.0–17.0)
MCH: 28.6 pg (ref 26.0–34.0)
MCH: 30.2 pg (ref 26.0–34.0)
MCHC: 33.2 g/dL (ref 30.0–36.0)
MCHC: 34.1 g/dL (ref 30.0–36.0)
MCV: 86.1 fL (ref 80.0–100.0)
MCV: 88.5 fL (ref 80.0–100.0)
Platelets: 96 10*3/uL — ABNORMAL LOW (ref 150–400)
Platelets: 96 10*3/uL — ABNORMAL LOW (ref 150–400)
RBC: 2.73 MIL/uL — ABNORMAL LOW (ref 4.22–5.81)
RBC: 2.62 MIL/uL — ABNORMAL LOW (ref 4.22–5.81)
RDW: 26.8 % — ABNORMAL HIGH (ref 11.5–15.5)
RDW: 27.3 % — ABNORMAL HIGH (ref 11.5–15.5)
WBC: 15.1 10*3/uL — ABNORMAL HIGH (ref 4.0–10.5)
WBC: 13.4 10*3/uL — ABNORMAL HIGH (ref 4.0–10.5)
nRBC: 0.3 % — ABNORMAL HIGH (ref 0.0–0.2)
nRBC: 0.4 % — ABNORMAL HIGH (ref 0.0–0.2)

## 2020-10-11 LAB — URINALYSIS, ROUTINE W REFLEX MICROSCOPIC
Bilirubin Urine: NEGATIVE
Glucose, UA: NEGATIVE mg/dL
Ketones, ur: NEGATIVE mg/dL
Nitrite: NEGATIVE
Protein, ur: 30 mg/dL — AB
Specific Gravity, Urine: 1.015 (ref 1.005–1.030)
pH: 5 (ref 5.0–8.0)

## 2020-10-11 LAB — RENAL FUNCTION PANEL
Albumin: 2.2 g/dL — ABNORMAL LOW (ref 3.5–5.0)
Anion gap: 11 (ref 5–15)
BUN: 128 mg/dL — ABNORMAL HIGH (ref 8–23)
CO2: 21 mmol/L — ABNORMAL LOW (ref 22–32)
Calcium: 10.4 mg/dL — ABNORMAL HIGH (ref 8.9–10.3)
Chloride: 108 mmol/L (ref 98–111)
Creatinine, Ser: 3.62 mg/dL — ABNORMAL HIGH (ref 0.61–1.24)
GFR, Estimated: 17 mL/min — ABNORMAL LOW (ref >60)
Glucose, Bld: 143 mg/dL — ABNORMAL HIGH (ref 70–99)
Phosphorus: 3.3 mg/dL (ref 2.5–4.6)
Potassium: 3.8 mmol/L (ref 3.5–5.1)
Sodium: 140 mmol/L (ref 135–145)

## 2020-10-11 LAB — PROTIME-INR
INR: 2.1 — ABNORMAL HIGH (ref 0.8–1.2)
INR: 2.1 — ABNORMAL HIGH (ref 0.8–1.2)
Prothrombin Time: 22.9 seconds — ABNORMAL HIGH (ref 11.4–15.2)
Prothrombin Time: 23.2 seconds — ABNORMAL HIGH (ref 11.4–15.2)

## 2020-10-11 LAB — MAGNESIUM: Magnesium: 2 mg/dL (ref 1.7–2.4)

## 2020-10-11 LAB — APTT: aPTT: 42 seconds — ABNORMAL HIGH (ref 24–36)

## 2020-10-11 NOTE — Progress Notes (Signed)
Pulmonary Critical Care Medicine [place]   PULMONARY CRITICAL CARE SERVICE  PROGRESS NOTE  Date of Service: 10/11/2020  Jesus Boyd  [id number]  DOB: 1945-11-16   DOA: 10/03/2020  Referring Physician: Merton Border, MD  HPI: Jesus Boyd is a 75 y.o. male seen for follow up of Acute on Chronic Respiratory Failure.  Patient is comfortable right now without distress remains on 30 L of oxygen  Medications: Reviewed on Rounds  Physical Exam:  Vitals: Temperature is 100.3 pulse 112 respiratory 36 blood pressure is 93/53 saturations 93%  Ventilator Settings off the ventilator on high flow oxygen  . General: Comfortable at this time . Eyes: Grossly normal lids, irises & conjunctiva . ENT: grossly tongue is normal . Neck: no obvious mass . Cardiovascular: S1 S2 normal no gallop . Respiratory: Scattered rhonchi expansion is equal . Abdomen: soft . Skin: no rash seen on limited exam . Musculoskeletal: not rigid . Psychiatric:unable to assess . Neurologic: no seizure no involuntary movements         Lab Data:   Basic Metabolic Panel: Recent Labs  Lab 10/05/20 0516 10/08/20 0451 10/09/20 0417 10/10/20 0511 10/11/20 0430  NA 142 148* 142 139 140  K 5.0 5.1 4.3 3.9 3.8  CL 106 112* 106 107 108  CO2 '24 25 23 22 '$ 21*  GLUCOSE 320* 118* 191* 154* 143*  BUN 105* 127* 129* 131* 128*  CREATININE 3.45* 3.87* 3.89* 3.67* 3.62*  CALCIUM 11.4* 11.2* 10.5* 10.5* 10.4*  MG 1.9 2.2 2.1 2.0 2.0  PHOS 4.1  --  3.8 3.6 3.3    ABG: Recent Labs  Lab 10/06/20 0928  PHART 7.414  PCO2ART 42.1  PO2ART 52.6*  HCO3 26.5  O2SAT 83.2    Liver Function Tests: Recent Labs  Lab 10/05/20 0516 10/09/20 0417 10/10/20 0511 10/11/20 0430  ALBUMIN 2.6* 2.2* 2.2* 2.2*   No results for input(s): LIPASE, AMYLASE in the last 168 hours. No results for input(s): AMMONIA in the last 168 hours.  CBC: Recent Labs  Lab 10/05/20 0516 10/08/20 0451 10/09/20 0417  10/10/20 0511 10/11/20 0430  WBC 3.2* 12.0* 14.1* 15.0* 15.1*  HGB 8.9* 8.7* 8.1* 8.0* 7.8*  HCT 24.0* 26.7* 24.0* 23.1* 23.5*  MCV 89.2 87.8 88.6 88.2 86.1  PLT 95* 103* 97* 97* 96*    Cardiac Enzymes: No results for input(s): CKTOTAL, CKMB, CKMBINDEX, TROPONINI in the last 168 hours.  BNP (last 3 results) No results for input(s): BNP in the last 8760 hours.  ProBNP (last 3 results) No results for input(s): PROBNP in the last 8760 hours.  Radiological Exams: No results found.  Assessment/Plan Active Problems:   Acute on chronic respiratory failure with hypoxia (HCC)   COVID-19 virus infection   Chronic atrial fibrillation (HCC)   Chronic kidney disease, stage IV (severe) (HCC)   Multifocal pneumonia   1. Acute on chronic respiratory failure hypoxia we will continue with high flow oxygen therapy try to continue to titrate down as tolerated 2. Chronic atrial fibrillation medical management controlled we will continue to follow 3. COVID-19 virus infection recovery 4. Chronic kidney disease stage IV we will continue with supportive care 5. Multifocal pneumonia no change   I have personally seen and evaluated the patient, evaluated laboratory and imaging results, formulated the assessment and plan and placed orders. The Patient requires high complexity decision making with multiple systems involvement.  Rounds were done with the Respiratory Therapy Director and Staff therapists and discussed with nursing staff also.  Allyne Gee, MD [facility] Pulmonary Critical Care Medicine Sleep Medicine

## 2020-10-11 NOTE — Telephone Encounter (Signed)
I had a long talk by telephone with the patient's son, Lemarr, regarding his father's dry gangrene of both lower extremities.  Explained that in my opinion he has a higher chance of not surviving this hospitalization due to his multisystem organ failure.  I suggested the family consider comfort care.  He asked if there was any chance for survival if he did not have amputations and I said that there is not due to the extensive tissue loss.  I did explain that I felt that he had a high chance for mortality even with bilateral above-knee amputations.  He has very poor nutritional status.  Has renal failure, pulmonary failure and severe cardiac disease.  He has severe malnutrition.  The son reports that the family has made the decision to proceed with bilateral above-knee amputations and requested that I call his mother  I then called the patient's wife, Robyne Peers, and had a long discussion with her.  She has some difficulty understanding the magnitude of this.  She kept saying that if he has the amputation he will survive.  I explained to her that he is at very high risk for not surviving this hospitalization even if he did not have issues of ischemia in his lower extremities.  Again explained that he certainly could not survive without amputations but felt that he had a very high chance for mortality even if he went through above-knee amputations.  She stated that she wished to wait until Chong Wojdyla next week to give some more time for discussion for the decision with the family.  I did explain to the wife and son that we could potentially do his surgery tomorrow, Friday but I do not feel that there is any critical need to do this now versus Ranferi Clingan next week.  I certainly feel the primary service needs to continue to educate regarding realistic expectations on this very sick man with dementia and multisystem organ failure.  Would certainly consider supportive care and palliative care only.  We will follow with you

## 2020-10-12 ENCOUNTER — Other Ambulatory Visit (HOSPITAL_COMMUNITY): Payer: Medicare Other

## 2020-10-12 DIAGNOSIS — N184 Chronic kidney disease, stage 4 (severe): Secondary | ICD-10-CM | POA: Diagnosis not present

## 2020-10-12 DIAGNOSIS — I96 Gangrene, not elsewhere classified: Secondary | ICD-10-CM | POA: Diagnosis not present

## 2020-10-12 DIAGNOSIS — U071 COVID-19: Secondary | ICD-10-CM | POA: Diagnosis not present

## 2020-10-12 DIAGNOSIS — I482 Chronic atrial fibrillation, unspecified: Secondary | ICD-10-CM | POA: Diagnosis not present

## 2020-10-12 DIAGNOSIS — J9621 Acute and chronic respiratory failure with hypoxia: Secondary | ICD-10-CM | POA: Diagnosis not present

## 2020-10-12 LAB — CBC
HCT: 21.3 % — ABNORMAL LOW (ref 39.0–52.0)
Hemoglobin: 7.7 g/dL — ABNORMAL LOW (ref 13.0–17.0)
MCH: 31.8 pg (ref 26.0–34.0)
MCHC: 36.2 g/dL — ABNORMAL HIGH (ref 30.0–36.0)
MCV: 88 fL (ref 80.0–100.0)
Platelets: 105 10*3/uL — ABNORMAL LOW (ref 150–400)
RBC: 2.42 MIL/uL — ABNORMAL LOW (ref 4.22–5.81)
RDW: 27.1 % — ABNORMAL HIGH (ref 11.5–15.5)
WBC: 14.8 10*3/uL — ABNORMAL HIGH (ref 4.0–10.5)
nRBC: 0.4 % — ABNORMAL HIGH (ref 0.0–0.2)

## 2020-10-12 LAB — RENAL FUNCTION PANEL
Albumin: 2.3 g/dL — ABNORMAL LOW (ref 3.5–5.0)
Anion gap: 9 (ref 5–15)
BUN: 136 mg/dL — ABNORMAL HIGH (ref 8–23)
CO2: 22 mmol/L (ref 22–32)
Calcium: 10.6 mg/dL — ABNORMAL HIGH (ref 8.9–10.3)
Chloride: 107 mmol/L (ref 98–111)
Creatinine, Ser: 3.74 mg/dL — ABNORMAL HIGH (ref 0.61–1.24)
GFR, Estimated: 16 mL/min — ABNORMAL LOW (ref >60)
Glucose, Bld: 189 mg/dL — ABNORMAL HIGH (ref 70–99)
Phosphorus: 3.4 mg/dL (ref 2.5–4.6)
Potassium: 3.4 mmol/L — ABNORMAL LOW (ref 3.5–5.1)
Sodium: 138 mmol/L (ref 135–145)

## 2020-10-12 LAB — PROTIME-INR
INR: 2.1 — ABNORMAL HIGH (ref 0.8–1.2)
Prothrombin Time: 22.8 seconds — ABNORMAL HIGH (ref 11.4–15.2)

## 2020-10-12 LAB — HEPARIN LEVEL (UNFRACTIONATED)
Heparin Unfractionated: <0.1 IU/mL — ABNORMAL LOW (ref 0.30–0.70)
Heparin Unfractionated: 0.28 IU/mL — ABNORMAL LOW (ref 0.30–0.70)
Heparin Unfractionated: 0.24 IU/mL — ABNORMAL LOW (ref 0.30–0.70)

## 2020-10-12 LAB — MAGNESIUM: Magnesium: 2.1 mg/dL (ref 1.7–2.4)

## 2020-10-12 LAB — VANCOMYCIN, TROUGH
Vancomycin Tr: 17 ug/mL (ref 15–20)
Vancomycin Tr: 15 ug/mL (ref 15–20)

## 2020-10-12 NOTE — Progress Notes (Signed)
75 y.o. male [facility] patient. 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 Transferred to [facility]. 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 CT abd from 3.4.21 shows the anatomy in accessible position.  Per note from  vascular surgery dated 3.3.21 Recommendation that patient is made comfort care. She (wife)stated that she wished to wait until early next week to give some more time for discussion for the decision with the family.  IR procedure request is placed on hold until definitive decision made by family. Should family want to proceed Allergies and medications would need to be reviewed.

## 2020-10-12 NOTE — Progress Notes (Signed)
.  Patient ID: Jesus Boyd, male   DOB: 03-06-1946, 75 y.o.   MRN: [id number]  Progress Note    10/12/2020 11:21 AM * No surgery found *  Subjective: Unresponsive.  Occasionally groans.   There were no vitals filed for this visit. Physical Exam: No change.  Demarcating at the level ankle bilaterally.  No blistering.  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  CBC    Component Value Date/Time   WBC 14.8 (H) 10/12/2020 0034   RBC 2.42 (L) 10/12/2020 0034   HGB 7.7 (L) 10/12/2020 0034   HCT 21.3 (L) 10/12/2020 0034   PLT 105 (L) 10/12/2020 0034   MCV 88.0 10/12/2020 0034   MCH 31.8 10/12/2020 0034   MCHC 36.2 (H) 10/12/2020 0034   RDW 27.1 (H) 10/12/2020 0034   LYMPHSABS 0.4 (L) 09/29/2020 0559   MONOABS 0.2 09/29/2020 0559   EOSABS 0.3 09/29/2020 0559   BASOSABS 0.0 09/29/2020 0559    BMET    Component Value Date/Time   NA 138 10/12/2020 0034   K 3.4 (L) 10/12/2020 0034   CL 107 10/12/2020 0034   CO2 22 10/12/2020 0034   GLUCOSE 189 (H) 10/12/2020 0034   BUN 136 (H) 10/12/2020 0034   CREATININE 3.74 (H) 10/12/2020 0034   CALCIUM 10.6 (H) 10/12/2020 0034   GFRNONAA 16 (L) 10/12/2020 0034    INR    Component Value Date/Time   INR 2.1 (H) 10/12/2020 0034    No intake or output data in the 24 hours ending 10/12/20 1121   Assessment/Plan:  75 y.o. male had long discussion with the son and wife yesterday.  I recommended comfort care.  They are insistent to proceed with bilateral above-knee amputation.  I explained that I felt that he had a very high chance for in-hospital mortality regardless of surgery.  His Coumadin is being held and he is on heparin.  Plan for bilateral above-knee amputations Tedford Berg next week if family continues to desire this treatment plan.  Tentatively scheduled for Tuesday     Rosetta Posner, MD [facility] [phone] 10/12/2020 11:21 AM

## 2020-10-12 NOTE — Progress Notes (Signed)
Pulmonary Critical Care Medicine [place]   PULMONARY CRITICAL CARE SERVICE  PROGRESS NOTE  Date of Service: 10/12/2020  Jesus Boyd  [id number]  DOB: 01-01-1946   DOA: 09/18/2020  Referring Physician: Merton Border, MD  HPI: Jesus Boyd is a 75 y.o. male seen for follow up of Acute on Chronic Respiratory Failure.  Currently afebrile without distress remains on 3 L high flow 35% FiO2.  Currently family is now considering doing amputations.  Because of his oxygen status patient is likely going to end up having to be intubated on mechanical ventilation.  Also primary care team is trying to figure out if they can speak with surgery and if surgery is willing to do the amputation.  Medications: Reviewed on Rounds  Physical Exam:  Vitals:  Temperature is 97.3 pulse 74 respiratory 36 blood pressure is 110/46 saturations 96%  Ventilator Settings on 30 L FiO2 35%  . General: Comfortable at this time . Eyes: Grossly normal lids, irises & conjunctiva . ENT: grossly tongue is normal . Neck: no obvious mass . Cardiovascular: S1 S2 normal no gallop . Respiratory: Coarse rhonchi expansion equal at this time . Abdomen: soft . Skin: no rash seen on limited exam . Musculoskeletal: not rigid . Psychiatric:unable to assess . Neurologic: no seizure no involuntary movements         Lab Data:   Basic Metabolic Panel: Recent Labs  Lab 10/08/20 0451 10/09/20 0417 10/10/20 0511 10/11/20 0430 10/12/20 0034  NA 148* 142 139 140 138  K 5.1 4.3 3.9 3.8 3.4*  CL 112* 106 107 108 107  CO2 '25 23 22 '$ 21* 22  GLUCOSE 118* 191* 154* 143* 189*  BUN 127* 129* 131* 128* 136*  CREATININE 3.87* 3.89* 3.67* 3.62* 3.74*  CALCIUM 11.2* 10.5* 10.5* 10.4* 10.6*  MG 2.2 2.1 2.0 2.0 2.1  PHOS  --  3.8 3.6 3.3 3.4    ABG: Recent Labs  Lab 10/06/20 0928  PHART 7.414  PCO2ART 42.1  PO2ART 52.6*  HCO3 26.5  O2SAT 83.2    Liver Function Tests: Recent Labs  Lab  10/09/20 0417 10/10/20 0511 10/11/20 0430 10/12/20 0034  ALBUMIN 2.2* 2.2* 2.2* 2.3*   No results for input(s): LIPASE, AMYLASE in the last 168 hours. No results for input(s): AMMONIA in the last 168 hours.  CBC: Recent Labs  Lab 10/09/20 0417 10/10/20 0511 10/11/20 0430 10/11/20 1732 10/12/20 0034  WBC 14.1* 15.0* 15.1* 13.4* 14.8*  HGB 8.1* 8.0* 7.8* 7.9* 7.7*  HCT 24.0* 23.1* 23.5* 23.2* 21.3*  MCV 88.6 88.2 86.1 88.5 88.0  PLT 97* 97* 96* 96* 105*    Cardiac Enzymes: No results for input(s): CKTOTAL, CKMB, CKMBINDEX, TROPONINI in the last 168 hours.  BNP (last 3 results) No results for input(s): BNP in the last 8760 hours.  ProBNP (last 3 results) No results for input(s): PROBNP in the last 8760 hours.  Radiological Exams: CT ABDOMEN WO CONTRAST  Result Date: 10/12/2020 CLINICAL DATA:  Evaluate anatomy prior to potential percutaneous gastrostomy tube placement. EXAM: CT ABDOMEN WITHOUT CONTRAST TECHNIQUE: Multidetector CT imaging of the abdomen was performed following the standard protocol without IV contrast. COMPARISON:  CT the chest, abdomen pelvis-09/21/2020; CT abdomen pelvis-07/26/2017; abdominal radiograph-09/13/2020 FINDINGS: The lack of intravenous contrast limits the ability to evaluate solid abdominal organs. Examination is further degraded secondary to streak artifact from the patient's overlying bilateral upper extremities as well as patient motion artifact. Lower chest: Limited visualization the lower thorax demonstrates trace bilateral effusions  and interstitial thickening within the bilateral lung bases, similar to the 09/21/2020 examination. Marked cardiomegaly. Exuberant calcifications involving the mitral valve annulus. There is diffuse decreased attenuation intra cardiac blood pool suggestive of anemia. No pericardial effusion. Hepatobiliary: Normal hepatic contour. Post cholecystectomy. Trace amount of perihepatic ascites. Pancreas: The pancreas appears  atrophic. Spleen: Normal noncontrast appearance of the spleen. Adrenals/Urinary Tract: Bilateral kidneys are atrophic compatible with history of end-stage renal disease post renal transplantation which is seen within the right lower abdomen/pelvis. Multiple hypoattenuating cysts are seen bilaterally within the native kidneys, left greater than right. Additionally, there are peripherally calcified exophytic lesions arising from the superomedial aspect of the left sided native kidney with dominant lesion measuring approximately 2.8 cm in diameter (image 32, series 3), incompletely evaluated on this noncontrast examination though similar to the 07/2017 examination and thus favored to be of benign etiology. There is a punctate (approximately 2 mm) potential stone within the superior pole the right lower abdominal quadrant renal transplant (image 49, series 3). Minimal amount of likely age and body habitus related perinephric stranding. No urinary obstruction. Normal noncontrast appearance of the bilateral adrenal glands. Stomach/Bowel: The anterior wall of the stomach is well apposed against the ventral wall of the upper abdomen without interposition of the hepatic parenchyma or the transverse colon. Note percutaneous window will likely be improved with gastric insufflation. Enteric contrast is seen within the colon. No evidence of enteric obstruction. No pneumoperitoneum, pneumatosis or portal venous gas. No discrete areas of bowel wall thickening on this noncontrast examination. Normal noncontrast appearance of the terminal ileum. The appendix is not visualized, however there is no pericecal inflammatory change. Vascular/Lymphatic: Moderate to large amount of calcified atherosclerotic plaque within a normal caliber abdominal aorta. No bulky retroperitoneal or mesenteric adenopathy. Other: Small mesenteric fat containing periumbilical hernia. 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 Mild diffuse body wall anasarca. Embolization coils are seen L3 and L4 lumbar vertebral bodies. Musculoskeletal: No acute or aggressive osseous abnormalities. Grade 1 anterolisthesis of L4 upon L5 without associated pars defects. Mild multilevel lumbar spine DDD, likely worse at L4-L5. 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 IMPRESSION: 1. Gastric anatomy amenable to attempted percutaneous gastrostomy tube placement as indicated. 2. Marked cardiomegaly with trace bilateral effusions and interstitial thickening within the imaged lung bases, nonspecific though suggestive of pulmonary edema. 3. Punctate nonobstructing stone within the interpolar aspect the right lower quadrant renal transplant without evidence of urinary obstruction. 4. Additional stable ancillary findings as above 5. Aortic Atherosclerosis (ICD10-I70.0). Electronically Signed   By: Sandi Mariscal M.D.   On: 10/12/2020 08:01    Assessment/Plan Active Problems:   Acute on chronic respiratory failure with hypoxia (HCC)   COVID-19 virus infection   Chronic atrial fibrillation (HCC)   Chronic kidney disease, stage IV (severe) (HCC)   Multifocal pneumonia   1. Acute on chronic respiratory failure with hypoxia plan is going to be to continue with full support on high flow right now.  Patient is at high risk for intubation mechanical ventilation and postoperative complication 2. COVID-19 virus infection recovery 3. Chronic atrial fibrillation rate controlled 4. Chronic kidney disease stage IV we will continue to monitor. 5. Multifocal pneumonia very slow to improve   I have personally seen and evaluated the patient, evaluated laboratory and imaging results, formulated the assessment and plan and placed orders. The Patient requires high complexity decision making with multiple systems involvement.  Rounds were  done with the Respiratory Therapy Director and Staff therapists and discussed with  nursing staff also.  Allyne Gee, MD [facility] Pulmonary Critical Care Medicine Sleep Medicine

## 2020-10-12 NOTE — Progress Notes (Signed)
[facility]  ROUNDING NOTE   Subjective:  Vascular surgery notes reviewed. 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 It appears that his prognosis appears poor despite possible surgical intervention. Family wishing to move forward with bilateral lower extremity amputations. Continues to have significant renal dysfunction with creatinine of 3.7 however urine output was 1.8 L over the preceding 24 hours.    Objective:  Vital signs in last 24 hours:  Temperature 98.1 pulse 80 respirations 24 blood pressure 153/67  Physical Exam: General:  Critically ill-appearing  Head:  Normocephalic, atraumatic.  Dry oral mucosa  Eyes:  Anicteric  Neck:  Supple  Lungs:   Scattered rhonchi, normal effort  Heart:  S1S2 irregular  Abdomen:   Soft, nontender, bowel sounds present  Extremities:  No peripheral edema.  Neurologic:  Lethargic, but arousable  Skin:  No acute rash  Access:  No functional hemodialysis access    Basic Metabolic Panel: Recent Labs  Lab 10/08/20 0451 10/09/20 0417 10/10/20 0511 10/11/20 0430 10/12/20 0034  NA 148* 142 139 140 138  K 5.1 4.3 3.9 3.8 3.4*  CL 112* 106 107 108 107  CO2 _0 21* 22  GLUCOSE 118* 191* 154* 143* 189*  BUN 127* 129* 131* 128* 136*  CREATININE 3.87* 3.89* 3.67* 3.62* 3.74*  CALCIUM 11.2* 10.5* 10.5* 10.4* 10.6*  MG 2.2 2.1 2.0 2.0 2.1  PHOS  --  3.8 3.6 3.3 3.4    Liver Function Tests: Recent Labs  Lab 10/09/20 0417 10/10/20 0511 10/11/20 0430 10/12/20 0034  ALBUMIN 2.2* 2.2* 2.2* 2.3*   No results for input(s): LIPASE, AMYLASE in the last 168 hours. No results for input(s): AMMONIA in the last 168 hours.  CBC: Recent Labs  Lab 10/09/20 0417 10/10/20 0511 10/11/20 0430 10/11/20 1732 10/12/20 0034  WBC 14.1* 15.0* 15.1* 13.4* 14.8*  HGB 8.1* 8.0* 7.8* 7.9* 7.7*  HCT 24.0* 23.1* 23.5* 23.2* 21.3*  MCV 88.6 88.2 86.1 88.5 88.0  PLT 97* 97* 96* 96* 105*    Cardiac Enzymes: No  results for input(s): CKTOTAL, CKMB, CKMBINDEX, TROPONINI in the last 168 hours.  BNP: Invalid input(s): POCBNP  CBG: No results for input(s): GLUCAP in the last 168 hours.  Microbiology: No results found for this or any previous visit.  Coagulation Studies: Recent Labs    10/10/20 0511 10/11/20 0430 10/11/20 1732 10/12/20 0034  LABPROT 25.0* 22.9* 23.2* 22.8*  INR 2.4* 2.1* 2.1* 2.1*    Urinalysis: Recent Labs    10/11/20 1630  COLORURINE YELLOW  LABSPEC 1.015  PHURINE 5.0  GLUCOSEU NEGATIVE  HGBUR MODERATE*  BILIRUBINUR NEGATIVE  KETONESUR NEGATIVE  PROTEINUR 30*  NITRITE NEGATIVE  LEUKOCYTESUR TRACE*      Imaging: CT ABDOMEN WO CONTRAST  Result Date: 10/12/2020 CLINICAL DATA:  Evaluate anatomy prior to potential percutaneous gastrostomy tube placement. EXAM: CT ABDOMEN WITHOUT CONTRAST TECHNIQUE: Multidetector CT imaging of the abdomen was performed following the standard protocol without IV contrast. COMPARISON:  CT the chest, abdomen pelvis-09/21/2020; CT abdomen pelvis-07/26/2017; abdominal radiograph-09/20/2020 FINDINGS: The lack of intravenous contrast limits the ability to evaluate solid abdominal organs. Examination is further degraded secondary to streak artifact from the patient's overlying bilateral upper extremities as well as patient motion artifact. Lower chest: Limited visualization the lower thorax demonstrates trace bilateral effusions and interstitial thickening within the bilateral lung bases, similar to the 09/21/2020 examination. Marked cardiomegaly. Exuberant calcifications involving the mitral valve annulus. There is diffuse decreased attenuation intra cardiac blood pool  suggestive of anemia. No pericardial effusion. Hepatobiliary: Normal hepatic contour. Post cholecystectomy. Trace amount of perihepatic ascites. Pancreas: The pancreas appears atrophic. Spleen: Normal noncontrast appearance of the spleen. Adrenals/Urinary Tract: Bilateral kidneys are  atrophic compatible with history of end-stage renal disease post renal transplantation which is seen within the right lower abdomen/pelvis. Multiple hypoattenuating cysts are seen bilaterally within the native kidneys, left greater than right. Additionally, there are peripherally calcified exophytic lesions arising from the superomedial aspect of the left sided native kidney with dominant lesion measuring approximately 2.8 cm in diameter (image 32, series 3), incompletely evaluated on this noncontrast examination though similar to the 07/2017 examination and thus favored to be of benign etiology. There is a punctate (approximately 2 mm) potential stone within the superior pole the right lower abdominal quadrant renal transplant (image 49, series 3). Minimal amount of likely age and body habitus related perinephric stranding. No urinary obstruction. Normal noncontrast appearance of the bilateral adrenal glands. Stomach/Bowel: The anterior wall of the stomach is well apposed against the ventral wall of the upper abdomen without interposition of the hepatic parenchyma or the transverse colon. Note percutaneous window will likely be improved with gastric insufflation. Enteric contrast is seen within the colon. No evidence of enteric obstruction. No pneumoperitoneum, pneumatosis or portal venous gas. No discrete areas of bowel wall thickening on this noncontrast examination. Normal noncontrast appearance of the terminal ileum. The appendix is not visualized, however there is no pericecal inflammatory change. Vascular/Lymphatic: Moderate to large amount of calcified atherosclerotic plaque within a normal caliber abdominal aorta. No bulky retroperitoneal or mesenteric adenopathy. Other: Small mesenteric fat containing periumbilical hernia. 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 Mild diffuse body wall anasarca. Embolization coils are seen L3 and  L4 lumbar vertebral bodies. Musculoskeletal: No acute or aggressive osseous abnormalities. Grade 1 anterolisthesis of L4 upon L5 without associated pars defects. Mild multilevel lumbar spine DDD, likely worse at L4-L5. 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 IMPRESSION: 1. Gastric anatomy amenable to attempted percutaneous gastrostomy tube placement as indicated. 2. Marked cardiomegaly with trace bilateral effusions and interstitial thickening within the imaged lung bases, nonspecific though suggestive of pulmonary edema. 3. Punctate nonobstructing stone within the interpolar aspect the right lower quadrant renal transplant without evidence of urinary obstruction. 4. Additional stable ancillary findings as above 5. Aortic Atherosclerosis (ICD10-I70.0). Electronically Signed   By: Sandi Mariscal M.D.   On: 10/12/2020 08:01     Medications:       Assessment/ Plan:  75 y.o. 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  1.  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  Patient continues to have significant renal dysfunction.  BUN 136 with a creatinine of 3.74 and EGFR 16.  Urine output was 1.8 L over the patient 24 hours.  No immediate need for dialysis at the moment.  Agree with vascular surgery but overall prognosis appears poor.  2.  Hyperkalemia.  Potassium actually a bit low now.  Discontinue Lokelma.  3.  Anemia of chronic kidney disease.  Hemoglobin continues to drift down and is currently 7.7.  Consider  blood transfusion if hemoglobin  continues to drift down.  4.  Hypernatremia.  Now resolved.  Serum sodium 138.  Free water flushes decreased to 40 cc every 2 hours.    LOS: 0 Lorretta Kerce 3/4/20228:09 AM

## 2020-10-13 DIAGNOSIS — J9621 Acute and chronic respiratory failure with hypoxia: Secondary | ICD-10-CM | POA: Diagnosis not present

## 2020-10-13 DIAGNOSIS — I482 Chronic atrial fibrillation, unspecified: Secondary | ICD-10-CM | POA: Diagnosis not present

## 2020-10-13 DIAGNOSIS — N184 Chronic kidney disease, stage 4 (severe): Secondary | ICD-10-CM | POA: Diagnosis not present

## 2020-10-13 DIAGNOSIS — U071 COVID-19: Secondary | ICD-10-CM | POA: Diagnosis not present

## 2020-10-13 LAB — CBC
HCT: 27.6 % — ABNORMAL LOW (ref 39.0–52.0)
Hemoglobin: 8.5 g/dL — ABNORMAL LOW (ref 13.0–17.0)
MCH: 27 pg (ref 26.0–34.0)
MCHC: 30.8 g/dL (ref 30.0–36.0)
MCV: 87.6 fL (ref 80.0–100.0)
Platelets: 119 10*3/uL — ABNORMAL LOW (ref 150–400)
RBC: 3.15 MIL/uL — ABNORMAL LOW (ref 4.22–5.81)
RDW: 28.6 % — ABNORMAL HIGH (ref 11.5–15.5)
WBC: 15.4 10*3/uL — ABNORMAL HIGH (ref 4.0–10.5)
nRBC: 0.6 % — ABNORMAL HIGH (ref 0.0–0.2)

## 2020-10-13 LAB — RENAL FUNCTION PANEL
Albumin: 2.3 g/dL — ABNORMAL LOW (ref 3.5–5.0)
Anion gap: 9 (ref 5–15)
BUN: 132 mg/dL — ABNORMAL HIGH (ref 8–23)
CO2: 21 mmol/L — ABNORMAL LOW (ref 22–32)
Calcium: 10.8 mg/dL — ABNORMAL HIGH (ref 8.9–10.3)
Chloride: 111 mmol/L (ref 98–111)
Creatinine, Ser: 3.45 mg/dL — ABNORMAL HIGH (ref 0.61–1.24)
GFR, Estimated: 18 mL/min — ABNORMAL LOW (ref >60)
Glucose, Bld: 150 mg/dL — ABNORMAL HIGH (ref 70–99)
Phosphorus: 3.6 mg/dL (ref 2.5–4.6)
Potassium: 4 mmol/L (ref 3.5–5.1)
Sodium: 141 mmol/L (ref 135–145)

## 2020-10-13 LAB — URINE CULTURE: Culture: NO GROWTH

## 2020-10-13 LAB — MAGNESIUM: Magnesium: 1.9 mg/dL (ref 1.7–2.4)

## 2020-10-13 LAB — HEPARIN LEVEL (UNFRACTIONATED)
Heparin Unfractionated: 0.14 IU/mL — ABNORMAL LOW (ref 0.30–0.70)
Heparin Unfractionated: 0.37 IU/mL (ref 0.30–0.70)
Heparin Unfractionated: 0.35 IU/mL (ref 0.30–0.70)

## 2020-10-13 NOTE — Progress Notes (Signed)
Pulmonary Critical Care Medicine [place]   PULMONARY CRITICAL CARE SERVICE  PROGRESS NOTE  Date of Service: 10/13/2020  Jesus Boyd  [id number]  DOB: Jan 25, 1946   DOA: 09/20/2020  Referring Physician: Merton Border, MD  HPI: Jahem Deleeuw is a 75 y.o. male seen for follow up of Acute on Chronic Respiratory Failure.  Patient remains on heated high flow currently on 30 L with an FiO2 35%.  Seems to be comfortable right now without distress  Medications: Reviewed on Rounds  Physical Exam:  Vitals: Temperature is 97.2 pulse 82 respiratory 23 blood pressure is 143/77 saturations 95%  Ventilator Settings currently on heated high flow 30 L FiO2 35%   General: Comfortable at this time  Eyes: Grossly normal lids, irises & conjunctiva  ENT: grossly tongue is normal  Neck: no obvious mass  Cardiovascular: S1 S2 normal no gallop  Respiratory: Scattered rhonchi expansion is equal at this time  Abdomen: soft  Skin: no rash seen on limited exam  Musculoskeletal: not rigid  Psychiatric:unable to assess  Neurologic: no seizure no involuntary movements         Lab Data:   Basic Metabolic Panel: Recent Labs  Lab 10/09/20 0417 10/10/20 0511 10/11/20 0430 10/12/20 0034 10/13/20 0207 10/13/20 0208  NA 142 139 140 138  --  141  K 4.3 3.9 3.8 3.4*  --  4.0  CL 106 107 108 107  --  111  CO2 23 22 21* 22  --  21*  GLUCOSE 191* 154* 143* 189*  --  150*  BUN 129* 131* 128* 136*  --  132*  CREATININE 3.89* 3.67* 3.62* 3.74*  --  3.45*  CALCIUM 10.5* 10.5* 10.4* 10.6*  --  10.8*  MG 2.1 2.0 2.0 2.1 1.9  --   PHOS 3.8 3.6 3.3 3.4  --  3.6    ABG: No results for input(s): PHART, PCO2ART, PO2ART, HCO3, O2SAT in the last 168 hours.  Liver Function Tests: Recent Labs  Lab 10/09/20 0417 10/10/20 0511 10/11/20 0430 10/12/20 0034 10/13/20 0208  ALBUMIN 2.2* 2.2* 2.2* 2.3* 2.3*   No results for input(s): LIPASE, AMYLASE in the last 168 hours. No  results for input(s): AMMONIA in the last 168 hours.  CBC: Recent Labs  Lab 10/10/20 0511 10/11/20 0430 10/11/20 1732 10/12/20 0034 10/13/20 0207  WBC 15.0* 15.1* 13.4* 14.8* 15.4*  HGB 8.0* 7.8* 7.9* 7.7* 8.5*  HCT 23.1* 23.5* 23.2* 21.3* 27.6*  MCV 88.2 86.1 88.5 88.0 87.6  PLT 97* 96* 96* 105* 119*    Cardiac Enzymes: No results for input(s): CKTOTAL, CKMB, CKMBINDEX, TROPONINI in the last 168 hours.  BNP (last 3 results) No results for input(s): BNP in the last 8760 hours.  ProBNP (last 3 results) No results for input(s): PROBNP in the last 8760 hours.  Radiological Exams: CT ABDOMEN WO CONTRAST  Result Date: 10/12/2020 CLINICAL DATA:  Evaluate anatomy prior to potential percutaneous gastrostomy tube placement. EXAM: CT ABDOMEN WITHOUT CONTRAST TECHNIQUE: Multidetector CT imaging of the abdomen was performed following the standard protocol without IV contrast. COMPARISON:  CT the chest, abdomen pelvis-09/21/2020; CT abdomen pelvis-07/26/2017; abdominal radiograph-10/03/2020 FINDINGS: The lack of intravenous contrast limits the ability to evaluate solid abdominal organs. Examination is further degraded secondary to streak artifact from the patient's overlying bilateral upper extremities as well as patient motion artifact. Lower chest: Limited visualization the lower thorax demonstrates trace bilateral effusions and interstitial thickening within the bilateral lung bases, similar to the 09/21/2020 examination. Marked  cardiomegaly. Exuberant calcifications involving the mitral valve annulus. There is diffuse decreased attenuation intra cardiac blood pool suggestive of anemia. No pericardial effusion. Hepatobiliary: Normal hepatic contour. Post cholecystectomy. Trace amount of perihepatic ascites. Pancreas: The pancreas appears atrophic. Spleen: Normal noncontrast appearance of the spleen. Adrenals/Urinary Tract: Bilateral kidneys are atrophic compatible with history of end-stage renal  disease post renal transplantation which is seen within the right lower abdomen/pelvis. Multiple hypoattenuating cysts are seen bilaterally within the native kidneys, left greater than right. Additionally, there are peripherally calcified exophytic lesions arising from the superomedial aspect of the left sided native kidney with dominant lesion measuring approximately 2.8 cm in diameter (image 32, series 3), incompletely evaluated on this noncontrast examination though similar to the 07/2017 examination and thus favored to be of benign etiology. There is a punctate (approximately 2 mm) potential stone within the superior pole the right lower abdominal quadrant renal transplant (image 49, series 3). Minimal amount of likely age and body habitus related perinephric stranding. No urinary obstruction. Normal noncontrast appearance of the bilateral adrenal glands. Stomach/Bowel: The anterior wall of the stomach is well apposed against the ventral wall of the upper abdomen without interposition of the hepatic parenchyma or the transverse colon. Note percutaneous window will likely be improved with gastric insufflation. Enteric contrast is seen within the colon. No evidence of enteric obstruction. No pneumoperitoneum, pneumatosis or portal venous gas. No discrete areas of bowel wall thickening on this noncontrast examination. Normal noncontrast appearance of the terminal ileum. The appendix is not visualized, however there is no pericecal inflammatory change. Vascular/Lymphatic: Moderate to large amount of calcified atherosclerotic plaque within a normal caliber abdominal aorta. No bulky retroperitoneal or mesenteric adenopathy. Other: Small mesenteric fat containing periumbilical hernia. 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 Mild diffuse body wall anasarca. Embolization coils are seen L3 and L4 lumbar vertebral bodies. Musculoskeletal: No acute  or aggressive osseous abnormalities. Grade 1 anterolisthesis of L4 upon L5 without associated pars defects. Mild multilevel lumbar spine DDD, likely worse at L4-L5. 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 IMPRESSION: 1. Gastric anatomy amenable to attempted percutaneous gastrostomy tube placement as indicated. 2. Marked cardiomegaly with trace bilateral effusions and interstitial thickening within the imaged lung bases, nonspecific though suggestive of pulmonary edema. 3. Punctate nonobstructing stone within the interpolar aspect the right lower quadrant renal transplant without evidence of urinary obstruction. 4. Additional stable ancillary findings as above 5. Aortic Atherosclerosis (ICD10-I70.0). Electronically Signed   By: Sandi Mariscal M.D.   On: 10/12/2020 08:01   DG Abd 1 View  Result Date: 10/12/2020 CLINICAL DATA:  NG tube placement. EXAM: ABDOMEN - 1 VIEW COMPARISON:  CT earlier today. FINDINGS: Tip and side port of the enteric tube below the diaphragm in the stomach. Possible right renal stone. Cholecystectomy clips in the right upper quadrant embolization coils in the right mid abdomen. IMPRESSION: Tip and side port of the enteric tube below the diaphragm in the stomach. Electronically Signed   By: Keith Rake M.D.   On: 10/12/2020 20:06    Assessment/Plan Active Problems:   Acute on chronic respiratory failure with hypoxia (HCC)   COVID-19 virus infection   Chronic atrial fibrillation (HCC)   Chronic kidney disease, stage IV (severe) (HCC)   Multifocal pneumonia   1. Acute on chronic respiratory failure hypoxia we will continue with high flow oxygen.  Patient continues to require high flow.  We will continue with supportive care  monitor. 2. COVID-19 virus infection in recovery phase we will continue to monitor closely. 3. Chronic atrial fibrillation rate is controlled 4. Chronic kidney disease stage IV no change 5. Multifocal pneumonia has been treated we will continue  present management   I have personally seen and evaluated the patient, evaluated laboratory and imaging results, formulated the assessment and plan and placed orders. The Patient requires high complexity decision making with multiple systems involvement.  Rounds were done with the Respiratory Therapy Director and Staff therapists and discussed with nursing staff also.  Allyne Gee, MD [facility] Pulmonary Critical Care Medicine Sleep Medicine

## 2020-10-14 LAB — RENAL FUNCTION PANEL
Albumin: 2.2 g/dL — ABNORMAL LOW (ref 3.5–5.0)
Anion gap: 9 (ref 5–15)
BUN: 146 mg/dL — ABNORMAL HIGH (ref 8–23)
CO2: 23 mmol/L (ref 22–32)
Calcium: 10.9 mg/dL — ABNORMAL HIGH (ref 8.9–10.3)
Chloride: 109 mmol/L (ref 98–111)
Creatinine, Ser: 3.67 mg/dL — ABNORMAL HIGH (ref 0.61–1.24)
GFR, Estimated: 17 mL/min — ABNORMAL LOW (ref >60)
Glucose, Bld: 188 mg/dL — ABNORMAL HIGH (ref 70–99)
Phosphorus: 3.4 mg/dL (ref 2.5–4.6)
Potassium: 4.3 mmol/L (ref 3.5–5.1)
Sodium: 141 mmol/L (ref 135–145)

## 2020-10-14 LAB — CBC
HCT: 23.4 % — ABNORMAL LOW (ref 39.0–52.0)
Hemoglobin: 8 g/dL — ABNORMAL LOW (ref 13.0–17.0)
MCH: 31 pg (ref 26.0–34.0)
MCHC: 34.2 g/dL (ref 30.0–36.0)
MCV: 90.7 fL (ref 80.0–100.0)
Platelets: 137 10*3/uL — ABNORMAL LOW (ref 150–400)
RBC: 2.58 MIL/uL — ABNORMAL LOW (ref 4.22–5.81)
RDW: 29.3 % — ABNORMAL HIGH (ref 11.5–15.5)
WBC: 13.5 10*3/uL — ABNORMAL HIGH (ref 4.0–10.5)
nRBC: 1.3 % — ABNORMAL HIGH (ref 0.0–0.2)

## 2020-10-14 LAB — HEPARIN LEVEL (UNFRACTIONATED)
Heparin Unfractionated: 0.41 IU/mL (ref 0.30–0.70)
Heparin Unfractionated: 0.11 IU/mL — ABNORMAL LOW (ref 0.30–0.70)
Heparin Unfractionated: <0.1 IU/mL — ABNORMAL LOW (ref 0.30–0.70)
Heparin Unfractionated: 0.42 IU/mL (ref 0.30–0.70)

## 2020-10-14 LAB — MAGNESIUM: Magnesium: 2.1 mg/dL (ref 1.7–2.4)

## 2020-10-14 LAB — LACTIC ACID, PLASMA: Lactic Acid, Venous: 1.6 mmol/L (ref 0.5–1.9)

## 2020-10-14 NOTE — Progress Notes (Signed)
PROGRESS NOTE    Bryshawn Swagger  [id number] DOB: 1946/04/26 DOA: 09/15/2020  Brief Narrative:  Hawkin Zumalt is an 75 y.o. male with medical history significant of Alzheimer's dementia, atrial fibrillation, congestive heart failure, CKD, COPD, CVA, diabetes mellitus, hyperlipidemia, history of renal transplant in 2007, sleep apnea on CPAP who was admitted at [facility] when he initially presented to the ED on 09/07/2020 with complaints of worsening shortness of breath and fatigue.  In ED he was found to have fever of 101, oxygen saturation was 93% on 3 L nasal cannula.  He was also found to be leukopenic with creatinine above his baseline at 3.78.  Chest x-ray showed partially loculated right pleural effusion with persistent ill-defined opacity in the medial right base.  He was found to be positive for COVID-19 infection, sepsis likely secondary to bacterial pneumonia.  He was treated with Tocilizumab and was given Decadron for 10 days.  He was started on antibiotic treatment with IV vancomycin, cefepime. On 09/11/2020 his oxygen requirement escalated to 55 L, 100% FiO2 plus nonrebreather and he was transferred to the intensive care unit for further management.  His hospital course complicated by AKI on chronic kidney disease.  Throughout his hospital course he required varying amount of supplemental oxygen ranging from OptiFlow to BiPAP.  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  Infectious disease was consulted.  He was switched to meropenem.  He was seen by infectious disease and eventually antibiotics changed to ceftriaxone, vancomycin.  Plan to treat for duration of 6 weeks.  He also has a history of atrial fibrillation and was continued on metoprolol and Coumadin.  Due to his complex medical problems he was transferred and admitted to [facility].  He has been on treatment with IV vancomycin, ceftriaxone with  plan to treat for 6 weeks with tentative end date 10/24/2020 for the endocarditis.  However, he was found to have worsening leukocytosis.  Found to have dry gangrene and mummification of toes bilaterally.  Vascular surgery consulted.  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  He received treatment with Tocilizumab and dexamethasone at the acute facility.  He is also immunocompromised on immunosuppressive medication secondary to renal transplant.  He has probable secondary bacterial pneumonia with chest x-ray that showed severe multilobar bilateral pneumonia.  He received treatment with multiple antibiotics at the acute facility including IV vancomycin, meropenem.  He was on treatment with IV vancomycin, ceftriaxone with a plan to treat for a duration of 6 weeks with tentative end date 10/24/2020 for the endocarditis.  However, he had worsening leukocytosis, dry gangrene of bilateral lower extremity feet with concern for limb ischemia.  Therefore suggested to discontinue the ceftriaxone but continue with IV vancomycin and add meropenem.  Unable to send sputum cultures.  He continues to be on oxygen by nasal cannula. He also unfortunately has dysphagia and high risk for aspiration and recurrent aspiration pneumonia despite being on antibiotics.  COVID-19 infection: He was treated with Tocilizumab and Decadron at the  outside facility.  Here  he is started on folic acid, hydroxyurea.  He is high risk for sequelae from COVID-19 infection. Continue to monitor closely.  He is on oxygen by nasal cannula.  Pneumonia: His previous chest imaging consistent with severe multilobar bilateral pneumonia.  High suspicion for secondary bacterial pneumonia.  Unable to send sputum cultures as he is unable to produce sputum at this time.    He was on treatment with IV vancomycin, ceftriaxone.  Now discontinue the ceftriaxone and recommend to switch to meropenem in the setting of worsening leukocytosis, bilateral lower extremity gangrene.  Continue to monitor closely.  If his respiratory status worsens would recommend to repeat chest imaging preferably chest CT which could be done without contrast in the setting of renal compromise.    Bilateral lower extremity dry gangrene with concern for limb ischemia: ABI with critical limb ischemia.  Vascular surgery consulted.  Antibiotics switched to IV vancomycin, meropenem.  Vascular surgery was consulted.  They discussed with the patient's family and apparently the family is opting for bilateral above-knee amputations.  Suggest to continue treatment with the current antibiotics.  Plan to treat for a duration of 1 week from the date of surgery.  Prosthetic valve endocarditis: Patient had echocardiogram done at the acute facility which showed vegetation on the prosthetic mitral valve.  His cultures negative.  He was on IV vancomycin, ceftriaxone with plan treat for total duration of 6 weeks with tentative end date of 10/24/2020. However, now with leukocytosis, lower extremity ischemia/gangrene therefore switched to IV vancomycin, meropenem.  Please monitor BUN/creatinine closely while on antibiotics and adjust dose accordingly.  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  Antibiotics renally dosed.  Avoid nephrotoxic medication.  Nephrology consulted and  following.  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  Currently on antibiotics as mentioned above.  Encephalopathy: Unfortunately continues to remain encephalopathic.  Only moans and groans.  Antibiotics as mentioned above.  Continue management per the primary team.  Dysphagia/protein calorie malnutrition: Currently has an NG tube in place.  Unfortunately due to his dysphagia he is high risk for recurrent aspiration and aspiration pneumonia despite being on antibiotics.  Further management of protein calorie malnutrition per the primary team.  Diabetes mellitus type 2: Continue to monitor Accu-Cheks, medications and management of diabetes per the primary team.  History of Alzheimer's dementia: Continue supportive management per the primary team.  Atrial fibrillation: He remains tachycardic.  Continue medications and management per the primary team.  Chronic anticoagulation: Patient is on Coumadin.  Further management per the primary team. Unfortunately due to his complex medical problems he is very high risk for worsening and decompensation.  Overall poor prognosis. Plan of care discussed with the primary team and pharmacy  Subjective: He is encephalopathic, lethargic, only moans and groans.  Has NG tube in place with dried blood.  Remains on oxygen by nasal cannula.  With leukocytosis.  Objective: Vitals: Temperature 97.4, pulse 98, respiratory rate 25, blood pressure 125/51, pulse oximetry 98% Examination: Constitutional: Thin, frail, chronically ill-appearing male Head: Atraumatic, normocephalic Eyes: PERLA, EOMI ENMT: external ears and nose appear normal, normal hearing, has NG tube in place,  Lips appears normal, poor dentition, dry oral mucosa  Neck: neck appears normal, no masses CVS: S1-S2, irregular, tachycardic Respiratory: Coarse breath sounds,  rhonchi Abdomen: soft nontender, nondistended, normal bowel sounds Musculoskeletal: No edema, right hand finger with discoloration, lower extremity dry  gangrene with discoloration Neuro:  He is encephalopathic, not following any commands at this time. Psych: Encephalopathic Skin: No new rashes    Data Reviewed: I have personally reviewed following labs and imaging studies  CBC: Recent Labs  Lab 10/11/20 0430 10/11/20 1732 10/12/20 0034 10/13/20 0207 10/14/20 0331  WBC 15.1* 13.4* 14.8* 15.4* 13.5*  HGB 7.8* 7.9* 7.7* 8.5* 8.0*  HCT 23.5* 23.2* 21.3* 27.6* 23.4*  MCV 86.1 88.5 88.0 87.6 90.7  PLT 96* 96* 105* 119* 137*    Basic Metabolic Panel: Recent Labs  Lab 10/10/20 0511 10/11/20 0430 10/12/20 0034 10/13/20 0207 10/13/20 0208 10/14/20 0331  NA 139 140 138  --  141 141  K 3.9 3.8 3.4*  --  4.0 4.3  CL 107 108 107  --  111 109  CO2 22 21* 22  --  21* 23  GLUCOSE 154* 143* 189*  --  150* 188*  BUN 131* 128* 136*  --  132* 146*  CREATININE 3.67* 3.62* 3.74*  --  3.45* 3.67*  CALCIUM 10.5* 10.4* 10.6*  --  10.8* 10.9*  MG 2.0 2.0 2.1 1.9  --  2.1  PHOS 3.6 3.3 3.4  --  3.6 3.4    GFR: CrCl cannot be calculated (Unknown ideal weight.).  Liver Function Tests: Recent Labs  Lab 10/10/20 0511 10/11/20 0430 10/12/20 0034 10/13/20 0208 10/14/20 0331  ALBUMIN 2.2* 2.2* 2.3* 2.3* 2.2*    CBG: No results for input(s): GLUCAP in the last 168 hours.   Recent Results (from the past 240 hour(s))  Culture, blood (routine x 2)     Status: None (Preliminary result)   Collection Time: 10/11/20 11:49 AM   Specimen: BLOOD LEFT HAND  Result Value Ref Range Status   Specimen Description BLOOD LEFT HAND  Final   Special Requests   Final    BOTTLES DRAWN AEROBIC ONLY Blood Culture adequate volume   Culture   Final    NO GROWTH 3 DAYS Performed at [facility], [address], [place], [place] [address]    Report Status PENDING  Incomplete  Culture, blood  (routine x 2)     Status: None (Preliminary result)   Collection Time: 10/11/20 11:54 AM   Specimen: BLOOD LEFT WRIST  Result Value Ref Range Status   Specimen Description BLOOD LEFT WRIST  Final   Special Requests   Final    BOTTLES DRAWN AEROBIC ONLY Blood Culture adequate volume   Culture   Final    NO GROWTH 3 DAYS Performed at [facility], [place] [address], [place], [place] [address]    Report Status PENDING  Incomplete  Culture, Urine     Status: None   Collection Time: 10/11/20  9:03 PM   Specimen: Urine, Random  Result Value Ref Range Status   Specimen Description URINE, RANDOM  Final   Special Requests NONE  Final   Culture   Final    NO GROWTH Performed at [facility], [place] [address], [place], [place] [address]    Report Status 10/13/2020 FINAL  Final     Radiology Studies: No results found.   Scheduled Meds: Please see MAR    Yaakov Guthrie, MD  10/14/2020, 8:08 PM

## 2020-10-15 LAB — RENAL FUNCTION PANEL
Albumin: 2.3 g/dL — ABNORMAL LOW (ref 3.5–5.0)
Anion gap: 8 (ref 5–15)
BUN: 153 mg/dL — ABNORMAL HIGH (ref 8–23)
CO2: 23 mmol/L (ref 22–32)
Calcium: 10.9 mg/dL — ABNORMAL HIGH (ref 8.9–10.3)
Chloride: 109 mmol/L (ref 98–111)
Creatinine, Ser: 3.89 mg/dL — ABNORMAL HIGH (ref 0.61–1.24)
GFR, Estimated: 15 mL/min — ABNORMAL LOW (ref >60)
Glucose, Bld: 195 mg/dL — ABNORMAL HIGH (ref 70–99)
Phosphorus: 3.4 mg/dL (ref 2.5–4.6)
Potassium: 4.3 mmol/L (ref 3.5–5.1)
Sodium: 140 mmol/L (ref 135–145)

## 2020-10-15 LAB — CBC
HCT: 26.9 % — ABNORMAL LOW (ref 39.0–52.0)
Hemoglobin: 8.1 g/dL — ABNORMAL LOW (ref 13.0–17.0)
MCH: 27.6 pg (ref 26.0–34.0)
MCHC: 30.1 g/dL (ref 30.0–36.0)
MCV: 91.8 fL (ref 80.0–100.0)
Platelets: 170 10*3/uL (ref 150–400)
RBC: 2.93 MIL/uL — ABNORMAL LOW (ref 4.22–5.81)
RDW: 31.5 % — ABNORMAL HIGH (ref 11.5–15.5)
WBC: 11.4 10*3/uL — ABNORMAL HIGH (ref 4.0–10.5)
nRBC: 3 % — ABNORMAL HIGH (ref 0.0–0.2)

## 2020-10-15 LAB — TACROLIMUS LEVEL: Tacrolimus (FK506) - LabCorp: 7.4 ng/mL (ref 2.0–20.0)

## 2020-10-15 LAB — HEPARIN LEVEL (UNFRACTIONATED)
Heparin Unfractionated: 0.84 IU/mL — ABNORMAL HIGH (ref 0.30–0.70)
Heparin Unfractionated: <0.1 IU/mL — ABNORMAL LOW (ref 0.30–0.70)

## 2020-10-15 LAB — MAGNESIUM: Magnesium: 2.1 mg/dL (ref 1.7–2.4)

## 2020-10-15 LAB — PATHOLOGIST SMEAR REVIEW

## 2020-10-15 LAB — VANCOMYCIN, TROUGH: Vancomycin Tr: 17 ug/mL (ref 15–20)

## 2020-10-15 NOTE — Progress Notes (Addendum)
I had a lengthy phone conversation with Mrs. Shirleen Morad.  I explained that bilateral above knee amputations would not likely change the outcome of her husband's clinical course. I explained to her that with multisystem organ dysfunction, he was not expected to survive much longer. I again recommended she pursue comfort measures only. She seemed understanding. We will cancel surgery for tomorrow. I discussed the above with the primary service.  Please call for any questions.  Yevonne Aline. Stanford Breed, MD [facility] Phone Number: [phone] 10/15/2020 11:58 AM

## 2020-10-15 NOTE — Progress Notes (Signed)
[facility]  ROUNDING NOTE   Subjective:  Patient sitting up in chair. Difficult to arouse. It appears that she will be having bilateral above-knee amputations early this week. Continues to have worsening azotemia as BUN up to 153 with a creatinine of 3.89 and urine output down to 700 cc. As per discussions with multiple providers it appears that the family is requesting ongoing aggressive care. As such care team to discuss renal placement therapy though it is unlikely to change the short-term outcome.    Objective:  Vital signs in last 24 hours:  Temperature 98 pulse 92 respirations 28 blood pressure 121/41  Physical Exam: General:  Critically ill-appearing  Head:  Normocephalic, atraumatic.  Dry oral mucosa  Eyes:  Anicteric  Neck:  Supple  Lungs:   Scattered rhonchi, normal effort  Heart:  S1S2 irregular  Abdomen:   Soft, nontender, bowel sounds present  Extremities:  No peripheral edema.  Neurologic:  Lethargic, difficult to arouse  Skin:  No acute rash  Access:  No functional hemodialysis access    Basic Metabolic Panel: Recent Labs  Lab 10/11/20 0430 10/12/20 0034 10/13/20 0207 10/13/20 0208 10/14/20 0331 10/15/20 0553  NA 140 138  --  141 141 140  K 3.8 3.4*  --  4.0 4.3 4.3  CL 108 107  --  111 109 109  CO2 21* 22  --  21* 23 23  GLUCOSE 143* 189*  --  150* 188* 195*  BUN 128* 136*  --  132* 146* 153*  CREATININE 3.62* 3.74*  --  3.45* 3.67* 3.89*  CALCIUM 10.4* 10.6*  --  10.8* 10.9* 10.9*  MG 2.0 2.1 1.9  --  2.1 2.1  PHOS 3.3 3.4  --  3.6 3.4 3.4    Liver Function Tests: Recent Labs  Lab 10/11/20 0430 10/12/20 0034 10/13/20 0208 10/14/20 0331 10/15/20 0553  ALBUMIN 2.2* 2.3* 2.3* 2.2* 2.3*   No results for input(s): LIPASE, AMYLASE in the last 168 hours. No results for input(s): AMMONIA in the last 168 hours.  CBC: Recent Labs  Lab 10/11/20 1732 10/12/20 0034 10/13/20 0207 10/14/20 0331 10/15/20 0553  WBC 13.4* 14.8*  15.4* 13.5* 11.4*  HGB 7.9* 7.7* 8.5* 8.0* 8.1*  HCT 23.2* 21.3* 27.6* 23.4* 26.9*  MCV 88.5 88.0 87.6 90.7 91.8  PLT 96* 105* 119* 137* 170    Cardiac Enzymes: No results for input(s): CKTOTAL, CKMB, CKMBINDEX, TROPONINI in the last 168 hours.  BNP: Invalid input(s): POCBNP  CBG: No results for input(s): GLUCAP in the last 168 hours.  Microbiology: Results for orders placed or performed during the hospital encounter of 09/29/2020  Culture, blood (routine x 2)     Status: None (Preliminary result)   Collection Time: 10/11/20 11:49 AM   Specimen: BLOOD LEFT HAND  Result Value Ref Range Status   Specimen Description BLOOD LEFT HAND  Final   Special Requests   Final    BOTTLES DRAWN AEROBIC ONLY Blood Culture adequate volume   Culture   Final    NO GROWTH 3 DAYS Performed at [facility], [place] [address], [place], [place] [address]    Report Status PENDING  Incomplete  Culture, blood (routine x 2)     Status: None (Preliminary result)   Collection Time: 10/11/20 11:54 AM   Specimen: BLOOD LEFT WRIST  Result Value Ref Range Status   Specimen Description BLOOD LEFT WRIST  Final   Special Requests   Final    BOTTLES  DRAWN AEROBIC ONLY Blood Culture adequate volume   Culture   Final    NO GROWTH 3 DAYS Performed at [facility], [place] [address], [place], [place] [address]    Report Status PENDING  Incomplete  Culture, Urine     Status: None   Collection Time: 10/11/20  9:03 PM   Specimen: Urine, Random  Result Value Ref Range Status   Specimen Description URINE, RANDOM  Final   Special Requests NONE  Final   Culture   Final    NO GROWTH Performed at [facility], [place] [address], [place], [place] [address]    Report Status 10/13/2020 FINAL  Final    Coagulation Studies: No results for input(s): LABPROT, INR in the last 72 hours.  Urinalysis: No results for input(s): COLORURINE, LABSPEC, PHURINE, GLUCOSEU, HGBUR, BILIRUBINUR, KETONESUR, PROTEINUR,  UROBILINOGEN, NITRITE, LEUKOCYTESUR in the last 72 hours.  Invalid input(s): APPERANCEUR    Imaging: No results found.   Medications:       Assessment/ Plan:  75 y.o. 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  1.  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  Patient with ongoing worsening azotemia which may be playing a role in his mental status.  BUN up to 153, creatinine 3.89, EGFR 15.  Urine output also down to 700 cc.  Family has been requesting ongoing aggressive care.  As such the care team will discuss renal replacement therapy with them.  If they desire this we will refer the patient to interventional radiology for temporary dialysis catheter placement.  2.  Hyperkalemia.  Resolved at the moment.  Serum potassium down to 4.3.  3.  Anemia of chronic kidney disease.  Hemoglobin currently 8.1.  Continue periodically monitor.  4.  Hypernatremia.  Improved with free water flushes.  Serum sodium 140.    LOS: 0 Munsoor Lateef 3/7/20228:04 AM

## 2020-10-15 NOTE — Progress Notes (Signed)
   Patient Status: [facility] IP  Assessment and Plan: Patient in need of venous access.   Worsening renal failure Organ failure Scheduled for TEMPORARY dialysis catheter placement per Dr Holley Raring  ______________________________________________________________________   History of Present Illness: Jesus Boyd is a 75 y.o. male   Afib; CKD; CHF; COPD; CVA Covid PNA last month Intubated and critically ill Tx to [facility] for vent management Extubated  No precautions at this point  Hx Renal tx 2007 Now in organ failure Bilat LE ischemia; gangrene For vascular surgery AKA/BKA  Request for TEMP dialysis catheter placement in IR  Allergies and medications reviewed.   Review of Systems: A 12 point ROS discussed and pertinent positives are indicated in the HPI above.  All other systems are negative.   Vital Signs: There were no vitals taken for this visit.    COAGS: Recent Labs    10/10/20 0511 10/11/20 0430 10/11/20 1732 10/12/20 0034  INR 2.4* 2.1* 2.1* 2.1*  APTT  --   --  42*  --     BMP: Recent Labs    10/12/20 0034 10/13/20 0208 10/14/20 0331 10/15/20 0553  NA 138 141 141 140  K 3.4* 4.0 4.3 4.3  CL 107 111 109 109  CO2 22 21* 23 23  GLUCOSE 189* 150* 188* 195*  BUN 136* 132* 146* 153*  CALCIUM 10.6* 10.8* 10.9* 10.9*  CREATININE 3.74* 3.45* 3.67* 3.89*  GFRNONAA 16* 18* 17* 15*    Scheduled for non tunneled dialysis catheter in IR Spoke to pts wife Jesus Boyd via phone She is aware of procedure benefits and risks including but not limited to Infection; bleeding; vessel damage Agreeable to proceed Consent signe dand in IR  Electronically Signed: Lavonia Drafts, PA-C 10/15/2020, 9:34 AM   I spent a total of 15 minutes in face to face in clinical consultation, greater than 50% of which was counseling/coordinating care for venous access.

## 2020-10-16 LAB — CULTURE, BLOOD (ROUTINE X 2)
Culture: NO GROWTH
Culture: NO GROWTH
Special Requests: ADEQUATE
Special Requests: ADEQUATE

## 2020-10-16 SURGERY — AMPUTATION, ABOVE KNEE
Anesthesia: General | Site: Knee | Laterality: Bilateral

## 2020-10-19 ENCOUNTER — Encounter: Payer: Self-pay | Admitting: Internal Medicine

## 2020-10-19 DIAGNOSIS — I482 Chronic atrial fibrillation, unspecified: Secondary | ICD-10-CM | POA: Diagnosis present

## 2020-10-19 DIAGNOSIS — J189 Pneumonia, unspecified organism: Secondary | ICD-10-CM | POA: Diagnosis present

## 2020-10-19 DIAGNOSIS — J9621 Acute and chronic respiratory failure with hypoxia: Secondary | ICD-10-CM | POA: Diagnosis present

## 2020-10-19 DIAGNOSIS — U071 COVID-19: Secondary | ICD-10-CM | POA: Diagnosis present

## 2020-10-19 DIAGNOSIS — N184 Chronic kidney disease, stage 4 (severe): Secondary | ICD-10-CM | POA: Diagnosis present

## 2020-11-09 DEATH — deceased

## 2021-07-11 IMAGING — CT CT ABDOMEN W/O CM
2 of 4 series · 15 of 46 positions shown, 17 images · non-contrast
Comparison: CT the chest, abdomen pelvis-09/21/2020; CT abdomen
pelvis-07/26/2017; abdominal radiograph-09/28/2020

CLINICAL DATA: Evaluate anatomy prior to potential percutaneous
gastrostomy tube placement.

EXAM:
CT ABDOMEN WITHOUT CONTRAST
TECHNIQUE: Multidetector CT imaging of the abdomen was performed following the
standard protocol without IV contrast.

[Series 3: ap without · axial · non-contrast · 0.75mm/px · z∈[-258,+7]mm · 12 of 63 slices shown, 14 images]
[im 5/63  soft-tissue]
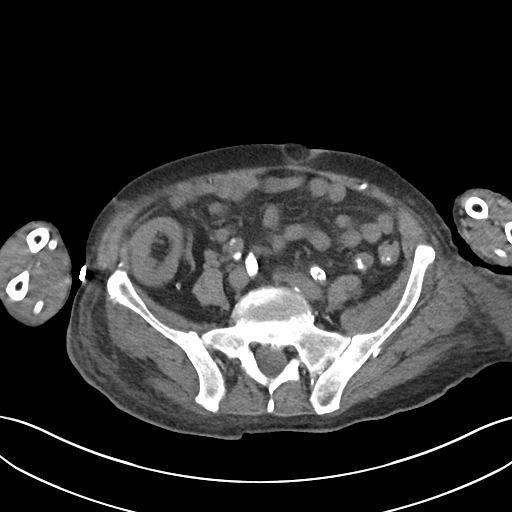
[im 5/63  bone]
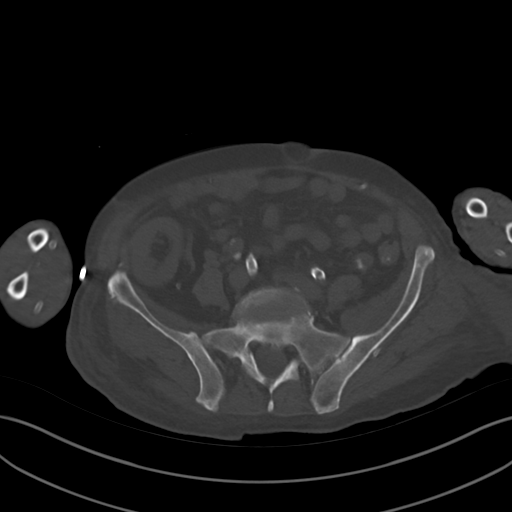
[im 9/63  soft-tissue]
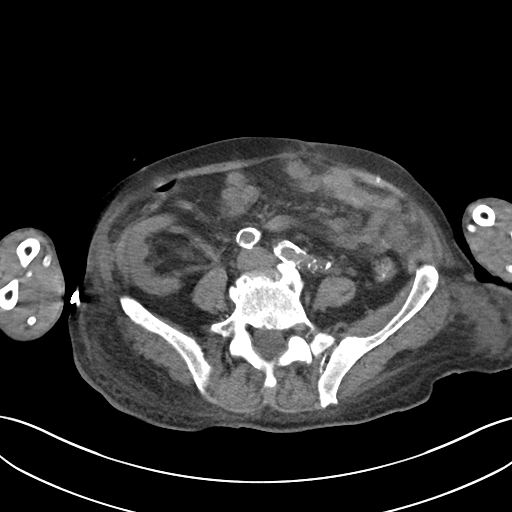
[im 13/63  soft-tissue]
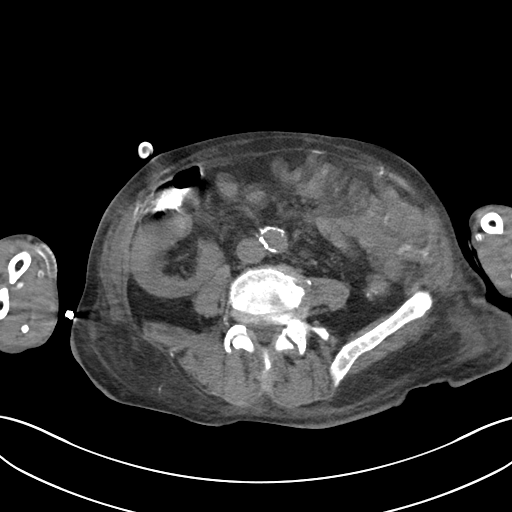
[im 21/63  soft-tissue]
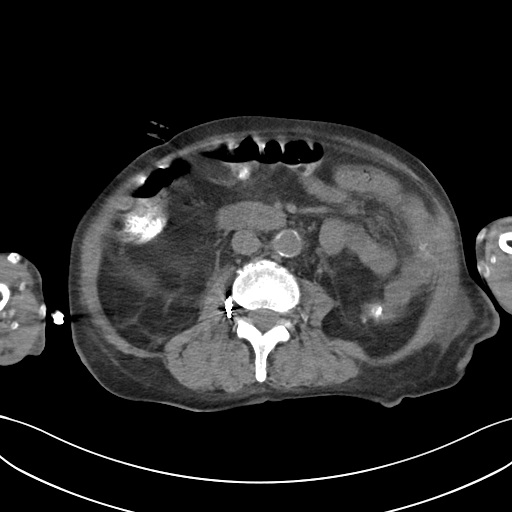
[im 25/63  soft-tissue]
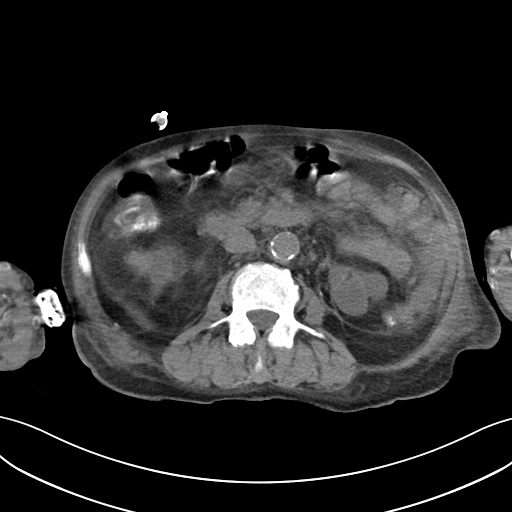
[im 29/63  soft-tissue]
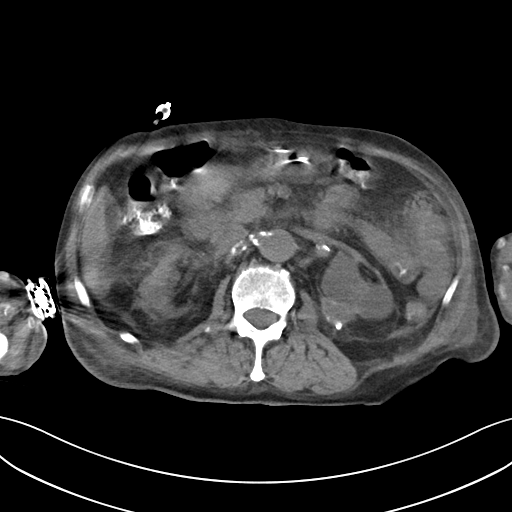
[im 34/63  soft-tissue]
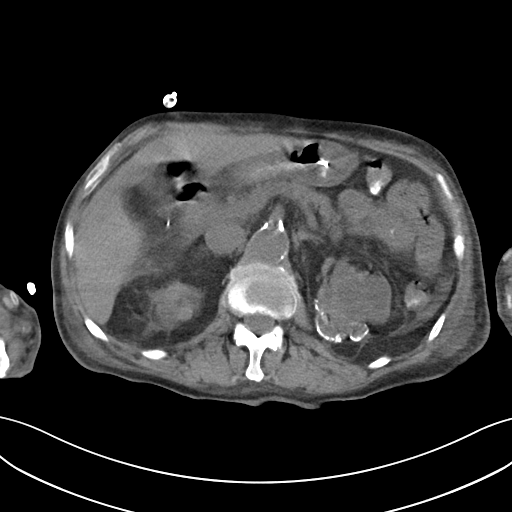
[im 38/63  soft-tissue]
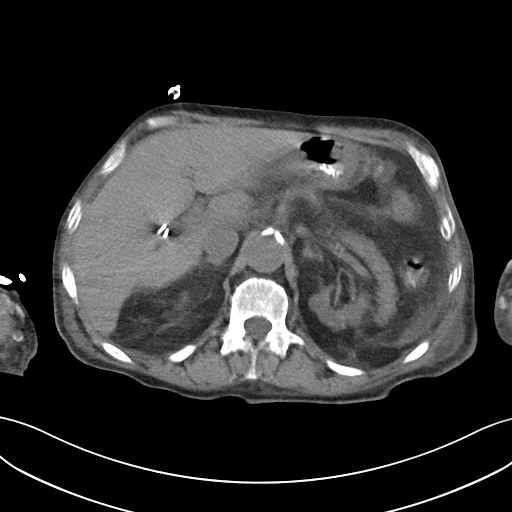
[im 42/63  soft-tissue]
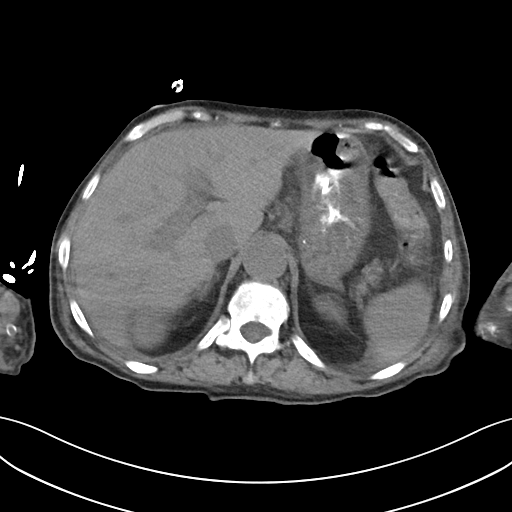
[im 42/63  bone]
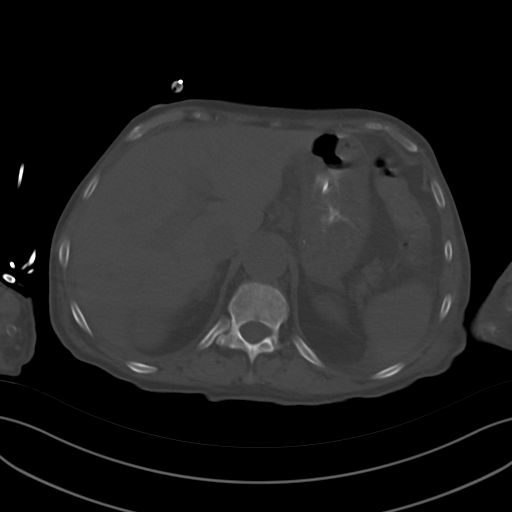
[im 50/63  soft-tissue]
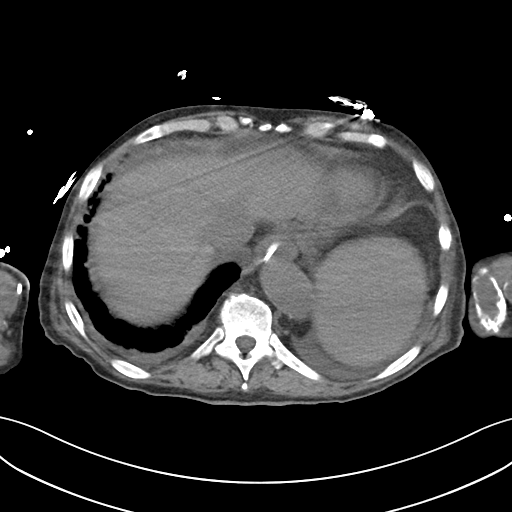
[im 54/63  soft-tissue]
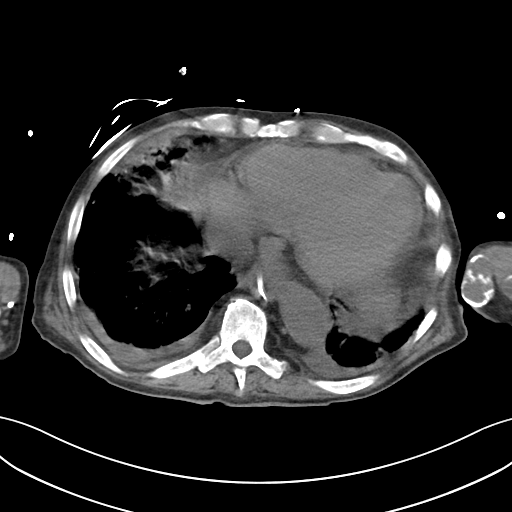
[im 58/63  soft-tissue]
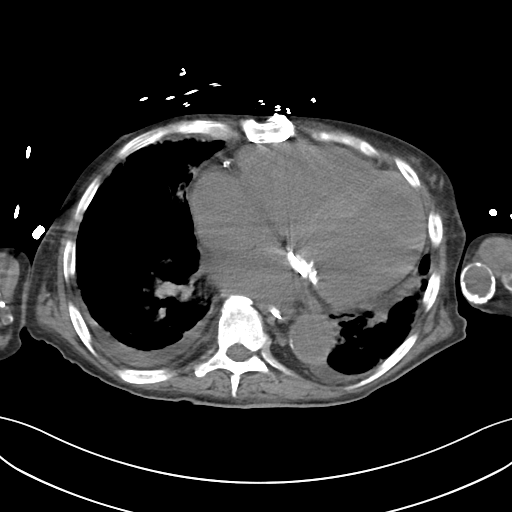

[Series 6: cor · coronal · 0.67mm/px · 3 of 89 slices shown]
[im 30/89  soft-tissue]
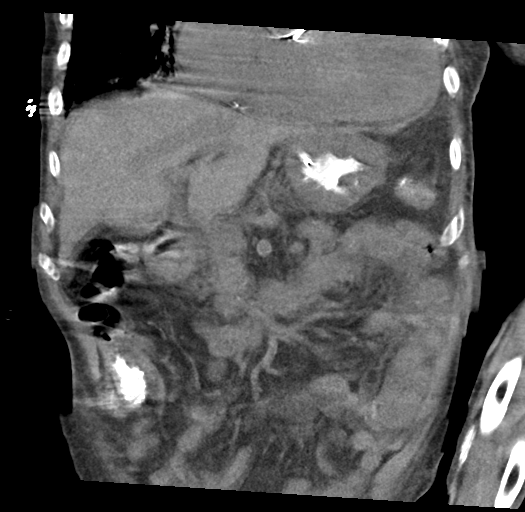
[im 40/89  soft-tissue]
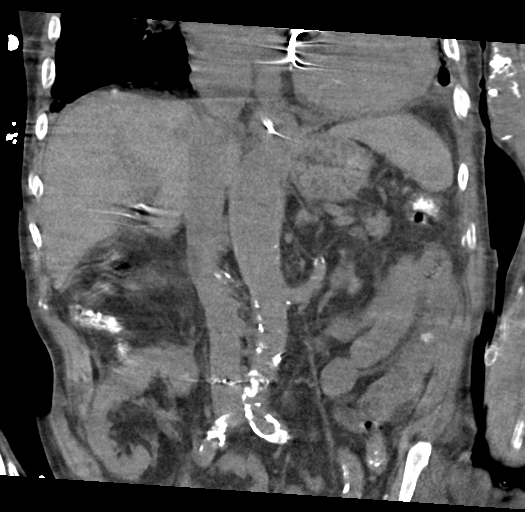
[im 49/89  soft-tissue]
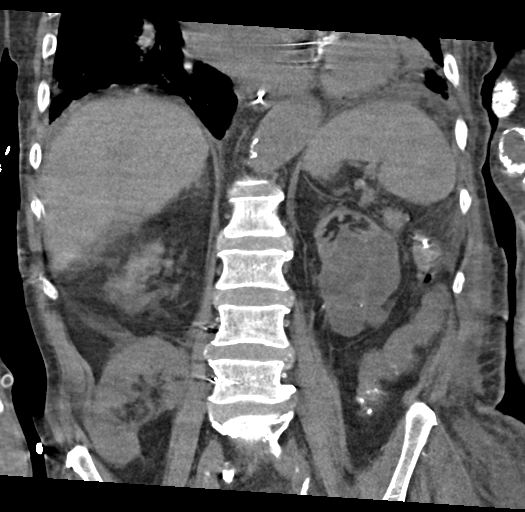

[15 of 46 positions shown; findings below may reference images not displayed]

FINDINGS: The lack of intravenous contrast limits the ability to evaluate
solid abdominal organs. Examination is further degraded secondary to
streak artifact from the patient's overlying bilateral upper
extremities as well as patient motion artifact.

Lower chest: Limited visualization the lower thorax demonstrates
trace bilateral effusions and interstitial thickening within the
bilateral lung bases, similar to the 09/21/2020 examination.

Marked cardiomegaly. Exuberant calcifications involving the mitral
valve annulus. There is diffuse decreased attenuation intra cardiac
blood pool suggestive of anemia. No pericardial effusion.

Hepatobiliary: Normal hepatic contour. Post cholecystectomy. Trace
amount of perihepatic ascites.

Pancreas: The pancreas appears atrophic.

Spleen: Normal noncontrast appearance of the spleen.

Adrenals/Urinary Tract: Bilateral kidneys are atrophic compatible
with history of end-stage renal disease post renal transplantation
which is seen within the right lower abdomen/pelvis. Multiple
hypoattenuating cysts are seen bilaterally within the native
kidneys, left greater than right. Additionally, there are
peripherally calcified exophytic lesions arising from the
superomedial aspect of the left sided native kidney with dominant
lesion measuring approximately 2.8 cm in diameter (image 32, series
3), incompletely evaluated on this noncontrast examination though
similar to the [DATE] examination and thus favored to be of benign
etiology.

There is a punctate (approximately 2 mm) potential stone within the
superior pole the right lower abdominal quadrant renal transplant
(image 49, series 3). Minimal amount of likely age and body habitus
related perinephric stranding. No urinary obstruction.

Normal noncontrast appearance of the bilateral adrenal glands.

Stomach/Bowel: The anterior wall of the stomach is well apposed
against the ventral wall of the upper abdomen without interposition
of the hepatic parenchyma or the transverse colon. Note percutaneous
window will likely be improved with gastric insufflation.

Enteric contrast is seen within the colon. No evidence of enteric
obstruction. No pneumoperitoneum, pneumatosis or portal venous gas.
No discrete areas of bowel wall thickening on this noncontrast
examination. Normal noncontrast appearance of the terminal ileum.
The appendix is not visualized, however there is no pericecal
inflammatory change.

Vascular/Lymphatic: Moderate to large amount of calcified
atherosclerotic plaque within a normal caliber abdominal aorta.

No bulky retroperitoneal or mesenteric adenopathy.

Other: Small mesenteric fat containing periumbilical hernia.
Presumed abandoned peripherally calcified AV fistula within medial
aspect of the incidentally imaged left upper arm. Mild diffuse body
wall anasarca. Embolization coils are seen L3 and L4 lumbar
vertebral bodies.

Musculoskeletal: No acute or aggressive osseous abnormalities. Grade
1 anterolisthesis of L4 upon L5 without associated pars defects.
Mild multilevel lumbar spine DDD, likely worse at L4-L5. Stigmata of
dish in the lower thoracic spine.
IMPRESSION: 1. Gastric anatomy amenable to attempted percutaneous gastrostomy
tube placement as indicated.
2. Marked cardiomegaly with trace bilateral effusions and
interstitial thickening within the imaged lung bases, nonspecific
though suggestive of pulmonary edema.
3. Punctate nonobstructing stone within the interpolar aspect the
right lower quadrant renal transplant without evidence of urinary
obstruction.
4. Additional stable ancillary findings as above
5. Aortic Atherosclerosis ([id number]).
